# Patient Record
Sex: Female | Born: 1995
Health system: Southern US, Community
[De-identification: ages and names within clinical notes are randomized; demographics above are authoritative.]

## PROBLEM LIST (undated history)

## (undated) ENCOUNTER — Ambulatory Visit (HOSPITAL_COMMUNITY): Source: Home / Self Care

## (undated) ENCOUNTER — Inpatient Hospital Stay (HOSPITAL_COMMUNITY): Payer: Self-pay

## (undated) DIAGNOSIS — M419 Scoliosis, unspecified: Secondary | ICD-10-CM

## (undated) DIAGNOSIS — M1710 Unilateral primary osteoarthritis, unspecified knee: Secondary | ICD-10-CM

## (undated) DIAGNOSIS — M179 Osteoarthritis of knee, unspecified: Secondary | ICD-10-CM

## (undated) DIAGNOSIS — N83209 Unspecified ovarian cyst, unspecified side: Secondary | ICD-10-CM

---
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
## 2017-09-07 ENCOUNTER — Encounter (HOSPITAL_BASED_OUTPATIENT_CLINIC_OR_DEPARTMENT_OTHER): Payer: Self-pay | Admitting: *Deleted

## 2017-09-07 ENCOUNTER — Other Ambulatory Visit: Payer: Self-pay

## 2017-09-07 ENCOUNTER — Emergency Department (HOSPITAL_BASED_OUTPATIENT_CLINIC_OR_DEPARTMENT_OTHER): Payer: Self-pay

## 2017-09-07 ENCOUNTER — Emergency Department (HOSPITAL_BASED_OUTPATIENT_CLINIC_OR_DEPARTMENT_OTHER)
Admission: EM | Admit: 2017-09-07 | Discharge: 2017-09-07 | Disposition: A | Discharge status: Home / Self Care | Payer: Worker's Compensation | Attending: Emergency Medicine | Admitting: Emergency Medicine

## 2017-09-07 DIAGNOSIS — I10 Essential (primary) hypertension: Secondary | ICD-10-CM | POA: Insufficient documentation

## 2017-09-07 DIAGNOSIS — W230XXA Caught, crushed, jammed, or pinched between moving objects, initial encounter: Secondary | ICD-10-CM | POA: Insufficient documentation

## 2017-09-07 DIAGNOSIS — S5781XA Crushing injury of right forearm, initial encounter: Secondary | ICD-10-CM | POA: Insufficient documentation

## 2017-09-07 DIAGNOSIS — M79601 Pain in right arm: Secondary | ICD-10-CM | POA: Insufficient documentation

## 2017-09-07 DIAGNOSIS — Z87891 Personal history of nicotine dependence: Secondary | ICD-10-CM | POA: Insufficient documentation

## 2017-09-07 DIAGNOSIS — Y929 Unspecified place or not applicable: Secondary | ICD-10-CM | POA: Insufficient documentation

## 2017-09-07 DIAGNOSIS — T148XXA Other injury of unspecified body region, initial encounter: Secondary | ICD-10-CM

## 2017-09-07 DIAGNOSIS — Y998 Other external cause status: Secondary | ICD-10-CM | POA: Insufficient documentation

## 2017-09-07 DIAGNOSIS — Y939 Activity, unspecified: Secondary | ICD-10-CM | POA: Insufficient documentation

## 2017-09-07 DIAGNOSIS — Z79899 Other long term (current) drug therapy: Secondary | ICD-10-CM | POA: Insufficient documentation

## 2017-09-07 MED ORDER — METHOCARBAMOL 500 MG PO TABS: 500.0000 mg | ORAL_TABLET | Freq: Every evening | ORAL | 0 refills | Status: DC | PRN

## 2017-09-07 MED ORDER — NAPROXEN 500 MG PO TABS: 500.0000 mg | ORAL_TABLET | Freq: Two times a day (BID) | ORAL | 0 refills | Status: DC

## 2017-09-07 MED FILL — NAPROXEN 500 MG TABLET: 500 | 15 days supply | Qty: 30 | Fill #0

## 2017-09-07 MED FILL — METHOCARBAMOL 500 MG TABLET: 500 | 4 days supply | Qty: 4 | Fill #0

## 2017-09-07 NOTE — ED Triage Notes (Signed)
2 bracelets and 1 ring removed and given to pt in case of swelling.

## 2017-09-07 NOTE — ED Triage Notes (Signed)
Pt reports a coworker accidentally closed a door on her right arm. + radial pulse, able to move fingers,warm to touch, cap refill < 3 sec, pt denies any other injuries or c/o.

## 2017-09-07 NOTE — ED Provider Notes (Signed)
[facility] EMERGENCY DEPARTMENT Provider Note   CSN: [id number] Arrival date & time: 09/07/17  1157     History   Chief Complaint Chief Complaint  Patient presents with  . Arm Pain    HPI Marilyn Ramirez is a 22 y.o. female presenting for evaluation of right forearm pain.  Pt states that just prior to arrival, somebody closed a door on her right forearm.  She reports pain of the dorsal aspect of her proximal right forearm.  No pain of the wrist or elbow.  She reports worsening pain with palmar flexion of the wrist.  She denies numbness or tingling.  She has not taken anything for pain including Tylenol or ibuprofen.  She has no other medical problems, does not take medications daily.  She denies injury elsewhere.  She is not on blood thinners.  It is constant, worse with movement and palpation.  Nothing makes it better.  No radiation of the pain  HPI  Past Medical History:  Diagnosis Date  . Osteoarthritis of knee   . Ovarian cyst   . Scoliosis     Patient Active Problem List   Diagnosis Date Noted  . Low back pain 03/14/2011  . HYPERTENSION 09/17/2007  . SKIN RASH 09/17/2007  . PROTEINURIA 09/17/2007  . DERMATITIS, ATOPIC 09/20/2006    History reviewed. No pertinent surgical history.   OB History   None      Home Medications    Prior to Admission medications   Medication Sig Start Date End Date Taking? Authorizing Provider  Dextrose-Fructose-Sod Citrate (NAUZENE) [id number] MG CHEW Chew 1 tablet by mouth daily as needed (nausea).    [provider]  methocarbamol (ROBAXIN) 500 MG tablet Take 1 tablet (500 mg total) by mouth at bedtime as needed for muscle spasms. 09/07/17   Oris Calmes, PA-C  naproxen (NAPROSYN) 500 MG tablet Take 1 tablet (500 mg total) by mouth 2 (two) times daily with a meal. 09/07/17   Kiyoshi Schaab, PA-C  omeprazole (PRILOSEC) 20 MG capsule Take 1 capsule (20 mg total) by mouth daily. 12/05/16   Hedges, Tinnie Gens,  PA-C  ondansetron (ZOFRAN ODT) 8 MG disintegrating tablet Take 1 tablet (8 mg total) by mouth every 8 (eight) hours as needed for nausea or vomiting. Patient not taking: Reported on 12/05/2016 09/28/16   Linwood Dibbles, MD    Family History History reviewed. No pertinent family history.  Social History Social History   Tobacco Use  . Smoking status: Former Games developer  . Smokeless tobacco: Never Used  Substance Use Topics  . Alcohol use: No  . Drug use: No     Allergies   Patient has no known allergies.   Review of Systems Review of Systems  Musculoskeletal: Positive for myalgias.  Neurological: Negative for numbness.     Physical Exam Updated Vital Signs BP 111/70 (BP Location: Left Arm)   Pulse 76   Temp 98.5 F (36.9 C) (Oral)   Resp 16   Ht  (1.575 m)   Wt 51.3 kg (113 lb)   LMP 08/23/2017   SpO2 100%   BMI 20.67 kg/m   Physical Exam  Constitutional: She is oriented to person, place, and time. She appears well-developed and well-nourished. No distress.  HENT:  Head: Normocephalic and atraumatic.  Eyes: EOM are normal.  Neck: Normal range of motion.  Pulmonary/Chest: Effort normal.  Abdominal: She exhibits no distension.  Musculoskeletal: Normal range of motion.  No obvious swelling, contusion, hematoma, or injury of  the right forearm.  Tenderness to palpation of the dorsal aspect of the proximal right forearm.  Full active range of motion of the wrist and elbow with pain of the forearm.  No tenderness to palpation of the distal forearm.  Soft compartments.  Radial pulses intact bilaterally.  Grip strength intact bilaterally.  No injury noted elsewhere.  Neurological: She is alert and oriented to person, place, and time.  Skin: Skin is warm. No rash noted.  Psychiatric: She has a normal mood and affect.  Nursing note and vitals reviewed.    ED Treatments / Results  Labs (all labs ordered are listed, but only abnormal results are displayed) Labs Reviewed -  No data to display  EKG None  Radiology Dg Forearm Right  Result Date: 09/07/2017 CLINICAL DATA:  Right arm got caught in the door. EXAM: RIGHT FOREARM - 2 VIEW COMPARISON:  None. FINDINGS: There is no evidence of fracture or other focal bone lesions. Soft tissues are unremarkable. IMPRESSION: Negative. Electronically Signed   By: Obie Dredge M.D.   On: 09/07/2017 12:33    Procedures Procedures (including critical care time)  Medications Ordered in ED Medications - No data to display   Initial Impression / Assessment and Plan / ED Course  I have reviewed the triage vital signs and the nursing notes.  Pertinent labs & imaging results that were available during my care of the patient were reviewed by me and considered in my medical decision making (see chart for details).     Patient resenting for evaluation of right forearm pain after to be closed arm in a door.  Physical exam reassuring, she is neurovascularly intact with soft compartments.  X-ray reviewed and interpreted by me, no fracture dislocation.  Discussed findings with patient.  Discussed this is likely muscular injury.  Discussed symptom medic treatment with NSAIDs, muscle relaxers, muscle creams, heat, ice, and stretching.  Follow-up as needed.  Return precautions given including signs for compartment syndrome.  At this time, patient appears safe for discharge.  Return precautions given.  Patient states she understands and agrees to plan.   Final Clinical Impressions(s) / ED Diagnoses   Final diagnoses:  Right arm pain  Muscle contusion    ED Discharge Orders        Ordered    naproxen (NAPROSYN) 500 MG tablet  2 times daily with meals     09/07/17 1419    methocarbamol (ROBAXIN) 500 MG tablet  At bedtime PRN     09/07/17 1419       Serapio Edelson, PA-C 09/07/17 1511    Tegeler, Canary Brim, MD 09/07/17 [phone]

## 2017-09-07 NOTE — Discharge Instructions (Signed)
Take naproxen 2 times a day with meals.  Do not take other anti-inflammatories at the same time open (Advil, Motrin, ibuprofen, Aleve). You may supplement with Tylenol if you need further pain control. Use ice packs or heating pads if this helps control your pain. Use Robaxin as needed for muscle stiffness or soreness.  Have caution, as this may make you tired or groggy.  Do not drive or operate heavy machinery while taking this medicine. Use muscle creams, such as BenGay, salon poss, or icy hot, to help with muscle pain. Follow up in 1 week if your symptoms are not improving. Return to the emergency room if you develop numbness, severe pain, your arm becomes very hard, or any new or concerning symptoms.

## 2018-04-09 ENCOUNTER — Encounter (HOSPITAL_COMMUNITY): Payer: Self-pay | Admitting: *Deleted

## 2018-04-09 ENCOUNTER — Ambulatory Visit (HOSPITAL_COMMUNITY)
Admission: EM | Admit: 2018-04-09 | Discharge: 2018-04-09 | Disposition: A | Discharge status: Home / Self Care | Payer: Self-pay | Attending: Urgent Care | Admitting: Urgent Care

## 2018-04-09 ENCOUNTER — Other Ambulatory Visit: Payer: Self-pay

## 2018-04-09 DIAGNOSIS — L03213 Periorbital cellulitis: Secondary | ICD-10-CM | POA: Insufficient documentation

## 2018-04-09 DIAGNOSIS — H5711 Ocular pain, right eye: Secondary | ICD-10-CM | POA: Insufficient documentation

## 2018-04-09 MED ORDER — CEFDINIR 300 MG PO CAPS: 300.0000 mg | ORAL_CAPSULE | Freq: Two times a day (BID) | ORAL | 0 refills | Status: DC

## 2018-04-09 NOTE — ED Triage Notes (Signed)
States her eyes started swelling Sat, states she used bendayl  And today noticed a rash around her eye.

## 2018-04-09 NOTE — ED Provider Notes (Signed)
MRN: [id number] DOB: 1995/08/27  Subjective:   Marilyn Ramirez is a 22 y.o. female presenting for 2-day history of bilateral eye swelling and eye discomfort.  States that her right eye continues to bother her including eyelid swelling and pain, discoloration over underside of her eyes bilaterally.  She has tried Benadryl to help with her symptoms and has experienced mild relief.  Denies history of severe allergies or eye trauma, fever, sinus pain, sinus congestion, ear pain, throat pain, cough.  No current facility-administered medications for this encounter.   Current Outpatient Medications:  .  Dextrose-Fructose-Sod Citrate (NAUZENE) [phone] MG CHEW, Chew 1 tablet by mouth daily as needed (nausea)., Disp: , Rfl:  .  methocarbamol (ROBAXIN) 500 MG tablet, Take 1 tablet (500 mg total) by mouth at bedtime as needed for muscle spasms., Disp: 4 tablet, Rfl: 0 .  naproxen (NAPROSYN) 500 MG tablet, Take 1 tablet (500 mg total) by mouth 2 (two) times daily with a meal., Disp: 30 tablet, Rfl: 0 .  omeprazole (PRILOSEC) 20 MG capsule, Take 1 capsule (20 mg total) by mouth daily., Disp: 30 capsule, Rfl: 0 .  ondansetron (ZOFRAN ODT) 8 MG disintegrating tablet, Take 1 tablet (8 mg total) by mouth every 8 (eight) hours as needed for nausea or vomiting. (Patient not taking: Reported on 12/05/2016), Disp: 12 tablet, Rfl: 0   No Known Allergies  Past Medical History:  Diagnosis Date  . Osteoarthritis of knee   . Ovarian cyst   . Scoliosis     History reviewed. No pertinent surgical history.  Objective:   Vitals: BP 104/65 (BP Location: Right Arm)   Pulse 83   Temp 98.2 F (36.8 C) (Oral)   Resp 14   LMP 03/22/2018 (Exact Date)   SpO2 99%   Physical Exam Constitutional:      General: She is not in acute distress.    Appearance: Normal appearance. She is well-developed. She is not ill-appearing, toxic-appearing or diaphoretic.  HENT:     Right Ear: Tympanic membrane normal.     Left Ear:  Tympanic membrane normal.     Nose: Nose normal. No congestion or rhinorrhea.     Mouth/Throat:     Mouth: Mucous membranes are moist.     Pharynx: Oropharynx is clear. No oropharyngeal exudate or posterior oropharyngeal erythema.  Eyes:     General: No scleral icterus.       Right eye: No discharge or hordeolum.        Left eye: No discharge or hordeolum.     Extraocular Movements: Extraocular movements intact.     Conjunctiva/sclera:     Right eye: Right conjunctiva is injected (Mildly). No chemosis, exudate or hemorrhage.    Left eye: Left conjunctiva is not injected. No chemosis, exudate or hemorrhage.    Pupils: Pupils are equal, round, and reactive to light.     Comments: Right eyelid swelling with slight hyperpigmentation of lower right eyelid.  Eyelids are tender to palpation.  Cardiovascular:     Rate and Rhythm: Normal rate.  Pulmonary:     Effort: Pulmonary effort is normal.  Skin:    General: Skin is warm and dry.  Neurological:     Mental Status: She is alert and oriented to person, place, and time.  Psychiatric:        Mood and Affect: Mood normal.        Behavior: Behavior normal.     Assessment and Plan :   Preseptal cellulitis of right eye  Acute right eye pain  Will cover for preseptal cellulitis with cefdinir which likely started from a allergic conjunctivitis.  Counseled that she needs to take Zyrtec and Opcon-A to address this.  Strict ER and return to clinic precautions reviewed.     Wallis Bamberg, [place] 04/09/18 2038

## 2018-04-09 NOTE — Discharge Instructions (Signed)
Take Zyrtec, Allegra or Claritin to address allergies as a source of your symptoms. You may take 500mg  Tylenol with ibuprofen 400-600mg  every 6 hours for pain and inflammation. Do not wait more than 2 days for a recheck if your symptoms are persisting.

## 2018-12-11 ENCOUNTER — Emergency Department (HOSPITAL_COMMUNITY): Payer: Self-pay

## 2018-12-11 ENCOUNTER — Emergency Department (HOSPITAL_COMMUNITY)
Admission: EM | Admit: 2018-12-11 | Discharge: 2018-12-11 | Disposition: A | Discharge status: Home / Self Care | Payer: Self-pay | Attending: Emergency Medicine | Admitting: Emergency Medicine

## 2018-12-11 ENCOUNTER — Other Ambulatory Visit: Payer: Self-pay

## 2018-12-11 ENCOUNTER — Encounter (HOSPITAL_COMMUNITY): Payer: Self-pay

## 2018-12-11 DIAGNOSIS — Y990 Civilian activity done for income or pay: Secondary | ICD-10-CM | POA: Insufficient documentation

## 2018-12-11 DIAGNOSIS — Y9289 Other specified places as the place of occurrence of the external cause: Secondary | ICD-10-CM | POA: Insufficient documentation

## 2018-12-11 DIAGNOSIS — M542 Cervicalgia: Secondary | ICD-10-CM | POA: Insufficient documentation

## 2018-12-11 DIAGNOSIS — S0990XA Unspecified injury of head, initial encounter: Secondary | ICD-10-CM

## 2018-12-11 DIAGNOSIS — I10 Essential (primary) hypertension: Secondary | ICD-10-CM | POA: Insufficient documentation

## 2018-12-11 DIAGNOSIS — W19XXXA Unspecified fall, initial encounter: Secondary | ICD-10-CM | POA: Insufficient documentation

## 2018-12-11 DIAGNOSIS — R55 Syncope and collapse: Secondary | ICD-10-CM

## 2018-12-11 DIAGNOSIS — Y9389 Activity, other specified: Secondary | ICD-10-CM | POA: Insufficient documentation

## 2018-12-11 DIAGNOSIS — Z87891 Personal history of nicotine dependence: Secondary | ICD-10-CM | POA: Insufficient documentation

## 2018-12-11 LAB — CBC
HCT: 40.3 % (ref 36.0–46.0)
Hemoglobin: 13.6 g/dL (ref 12.0–15.0)
MCH: 29.9 pg (ref 26.0–34.0)
MCHC: 33.7 g/dL (ref 30.0–36.0)
MCV: 88.6 fL (ref 80.0–100.0)
Platelets: 261 10*3/uL (ref 150–400)
RBC: 4.55 MIL/uL (ref 3.87–5.11)
RDW: 11.9 % (ref 11.5–15.5)
WBC: 4.6 10*3/uL (ref 4.0–10.5)
nRBC: 0 % (ref 0.0–0.2)

## 2018-12-11 LAB — I-STAT BETA HCG BLOOD, ED (MC, WL, AP ONLY): I-stat hCG, quantitative: <5 m[IU]/mL (ref <5)

## 2018-12-11 LAB — CBG MONITORING, ED: Glucose-Capillary: 78 mg/dL (ref 70–99)

## 2018-12-11 LAB — BASIC METABOLIC PANEL
Anion gap: 6 (ref 5–15)
BUN: 15 mg/dL (ref 6–20)
CO2: 25 mmol/L (ref 22–32)
Calcium: 8.8 mg/dL — ABNORMAL LOW (ref 8.9–10.3)
Chloride: 105 mmol/L (ref 98–111)
Creatinine, Ser: 0.72 mg/dL (ref 0.44–1.00)
GFR calc Af Amer: >60 mL/min (ref >60)
GFR calc non Af Amer: >60 mL/min (ref >60)
Glucose, Bld: 88 mg/dL (ref 70–99)
Potassium: 3.8 mmol/L (ref 3.5–5.1)
Sodium: 136 mmol/L (ref 135–145)

## 2018-12-11 MED ORDER — NAPROXEN 500 MG PO TABS: 500.0000 mg | ORAL_TABLET | Freq: Two times a day (BID) | ORAL | 0 refills | Status: DC

## 2018-12-11 MED ORDER — KETOROLAC TROMETHAMINE 30 MG/ML IJ SOLN
30.0000 mg | Freq: Once | INTRAMUSCULAR | Status: AC
  Administered 2018-12-11: 14:00:00 30 mg via INTRAMUSCULAR
  Filled 2018-12-11: qty 1

## 2018-12-11 MED ORDER — SODIUM CHLORIDE 0.9 % IV BOLUS
1000.0000 mL | Freq: Once | INTRAVENOUS | Status: AC
  Administered 2018-12-11: 1000 mL via INTRAVENOUS

## 2018-12-11 MED ORDER — ACETAMINOPHEN 325 MG PO TABS
650.0000 mg | ORAL_TABLET | Freq: Once | ORAL | Status: AC
  Administered 2018-12-11: 13:00:00 650 mg via ORAL
  Filled 2018-12-11: qty 2

## 2018-12-11 NOTE — Discharge Instructions (Signed)
Take the Naprosyn as needed.  Apply ice to help with the pain and discomfort.  Return to the ED as needed for worsening symptoms and consider follow-up with your primary care doctor if you have any recurrent episodes.

## 2018-12-11 NOTE — ED Triage Notes (Signed)
Pt BIB EMS from work. Pt reports passing out, falling down and hitting her head. Pt has c-collar on. Pt c/o neck pain. Upon arrival pt was groggy with EMS. Pt A&Ox4 with EMS.   113/78 CBG 89

## 2018-12-11 NOTE — ED Notes (Signed)
An After Visit Summary was printed and given to the patient. Discharge instructions given and no further questions at this time. Pt leaving with significant other 

## 2018-12-11 NOTE — ED Provider Notes (Signed)
[facility]-EMERGENCY DEPT Provider Note   CSN: [id number] Arrival date & time: 12/11/18  1145    History   Chief Complaint Chief Complaint  Patient presents with  . Loss of Consciousness    HPI Lauraine Rinneana Timoney is a 23 y.o. female.     HPI Patient presents to the emergency room for evaluation of a syncopal episode.  Patient states she was at work today.  She started to feel hot and overheated the next thing the patient remembers is when EMS arrived..  No seizure activity reported.  EMS arrived.  Patient was complaining of headache and neck pain.  She did feel a knot on the back of her head.  Patient states she was not feeling sick prior to that episode.  She has not had any trouble with headaches fevers or chills.  She has not been coughing.  No vomiting or diarrhea.  She has had normal menstrual periods.  Patient now does have pain primarily in the back of her head and her neck. Past Medical History:  Diagnosis Date  . Osteoarthritis of knee   . Ovarian cyst   . Scoliosis     Patient Active Problem List   Diagnosis Date Noted  . Low back pain 03/14/2011  . HYPERTENSION 09/17/2007  . SKIN RASH 09/17/2007  . PROTEINURIA 09/17/2007  . DERMATITIS, ATOPIC 09/20/2006    History reviewed. No pertinent surgical history.   OB History   No obstetric history on file.      Home Medications    Prior to Admission medications   Medication Sig Start Date End Date Taking? Authorizing Provider  methocarbamol (ROBAXIN) 500 MG tablet Take 1 tablet (500 mg total) by mouth at bedtime as needed for muscle spasms. Patient not taking: Reported on 12/11/2018 09/07/17   Caccavale, Sophia, PA-C  naproxen (NAPROSYN) 500 MG tablet Take 1 tablet (500 mg total) by mouth 2 (two) times daily. 12/11/18   Linwood DibblesKnapp, Kenishia Plack, MD  omeprazole (PRILOSEC) 20 MG capsule Take 1 capsule (20 mg total) by mouth daily. Patient not taking: Reported on 12/11/2018 12/05/16   Hedges, Tinnie GensJeffrey, PA-C   ondansetron (ZOFRAN ODT) 8 MG disintegrating tablet Take 1 tablet (8 mg total) by mouth every 8 (eight) hours as needed for nausea or vomiting. Patient not taking: Reported on 12/05/2016 09/28/16   Linwood DibblesKnapp, Lataisha Colan, MD    Family History History reviewed. No pertinent family history.  Social History Social History   Tobacco Use  . Smoking status: Former Games developermoker  . Smokeless tobacco: Never Used  Substance Use Topics  . Alcohol use: No  . Drug use: No     Allergies   Patient has no known allergies.   Review of Systems Review of Systems  All other systems reviewed and are negative.    Physical Exam Updated Vital Signs BP 112/73   Pulse 70   Temp 99 F (37.2 C) (Oral)   Resp (!) 21   LMP 11/20/2018   SpO2 99%   Physical Exam Vitals signs and nursing note reviewed.  Constitutional:      General: She is not in acute distress.    Appearance: She is well-developed.  HENT:     Head: Normocephalic.     Comments: Tenderness to palpation posterior occiput    Right Ear: External ear normal.     Left Ear: External ear normal.  Eyes:     General: No scleral icterus.       Right eye: No discharge.  Left eye: No discharge.     Conjunctiva/sclera: Conjunctivae normal.  Neck:     Musculoskeletal: Neck supple.     Trachea: No tracheal deviation.  Cardiovascular:     Rate and Rhythm: Normal rate and regular rhythm.  Pulmonary:     Effort: Pulmonary effort is normal. No respiratory distress.     Breath sounds: Normal breath sounds. No stridor. No wheezing or rales.  Abdominal:     General: Bowel sounds are normal. There is no distension.     Palpations: Abdomen is soft.     Tenderness: There is no abdominal tenderness. There is no guarding or rebound.  Musculoskeletal:     Right shoulder: She exhibits no tenderness, no bony tenderness and no swelling.     Left shoulder: She exhibits no tenderness, no bony tenderness and no swelling.     Right wrist: She exhibits no  tenderness, no bony tenderness and no swelling.     Left wrist: She exhibits no tenderness, no bony tenderness and no swelling.     Right hip: She exhibits normal range of motion, no tenderness, no bony tenderness and no swelling.     Left hip: She exhibits normal range of motion, no tenderness and no bony tenderness.     Right ankle: She exhibits no swelling. No tenderness.     Left ankle: She exhibits no swelling. No tenderness.     Cervical back: She exhibits tenderness and bony tenderness. She exhibits no swelling.     Thoracic back: She exhibits no tenderness, no bony tenderness and no swelling.     Lumbar back: She exhibits no tenderness, no bony tenderness and no swelling.  Skin:    General: Skin is warm and dry.     Findings: No rash.  Neurological:     Mental Status: She is alert.     Cranial Nerves: No cranial nerve deficit (no facial droop, extraocular movements intact, no slurred speech).     Sensory: No sensory deficit.     Motor: No abnormal muscle tone or seizure activity.     Coordination: Coordination normal.      ED Treatments / Results  Labs (all labs ordered are listed, but only abnormal results are displayed) Labs Reviewed  BASIC METABOLIC PANEL - Abnormal; Notable for the following components:      Result Value   Calcium 8.8 (*)    All other components within normal limits  CBC  CBG MONITORING, ED  I-STAT BETA HCG BLOOD, ED ([facility], [facility], [facility] ONLY)    EKG EKG Interpretation  Date/Time:  Tuesday December 11 2018 12:31:32 EDT Ventricular Rate:  69 PR Interval:    QRS Duration: 80 QT Interval:  385 QTC Calculation: 413 R Axis:   86 Text Interpretation:  Sinus rhythm No significant change since last tracing Confirmed by Linwood DibblesKnapp, Ruberta Holck [phone] on 12/11/2018 1:42:48 PM   Radiology Ct Head Wo Contrast  Result Date: 12/11/2018 CLINICAL DATA:  Syncope with fall EXAM: CT HEAD WITHOUT CONTRAST CT CERVICAL SPINE WITHOUT CONTRAST TECHNIQUE: Multidetector CT imaging of  the head and cervical spine was performed following the standard protocol without intravenous contrast. Multiplanar CT image reconstructions of the cervical spine were also generated. COMPARISON:  None. FINDINGS: CT HEAD FINDINGS Brain: The ventricles are normal in size and configuration. There is no intracranial mass, hemorrhage, extra-axial fluid collection, midline shift. Brain parenchyma appears unremarkable. No acute infarct evident. Vascular: No hyperdense vessel. No vascular calcifications are evident. Skull: The bony calvarium appears  intact. Sinuses/Orbits: There is opacification in a left-sided ethmoid air cell. Other visualized paranasal sinuses are clear. Orbits appear symmetric bilaterally. Other: Mastoid air cells are clear. CT CERVICAL SPINE FINDINGS Alignment: There is no evidence spondylolisthesis. Skull base and vertebrae: Skull base and craniocervical junction regions appear normal. No evident fracture. No blastic or lytic bone lesions. Soft tissues and spinal canal: Prevertebral soft tissues and predental space regions are normal. No cord or canal hematoma. No paraspinous lesions. Disc levels: Disc spaces appear unremarkable. There is no nerve root edema or effacement. No disc extrusion or stenosis. Upper chest: Visualized upper lung regions are clear. Other: None IMPRESSION: CT head: Opacification in a left ethmoid air cell. Study otherwise unremarkable. CT cervical spine: No fracture or spondylolisthesis. No appreciable arthropathy. No nerve root edema or effacement. No disc extrusion or stenosis. Electronically Signed   By: Bretta BangWilliam  Woodruff III M.D.   On: 12/11/2018 13:41   Ct Cervical Spine Wo Contrast  Result Date: 12/11/2018 CLINICAL DATA:  Syncope with fall EXAM: CT HEAD WITHOUT CONTRAST CT CERVICAL SPINE WITHOUT CONTRAST TECHNIQUE: Multidetector CT imaging of the head and cervical spine was performed following the standard protocol without intravenous contrast. Multiplanar CT image  reconstructions of the cervical spine were also generated. COMPARISON:  None. FINDINGS: CT HEAD FINDINGS Brain: The ventricles are normal in size and configuration. There is no intracranial mass, hemorrhage, extra-axial fluid collection, midline shift. Brain parenchyma appears unremarkable. No acute infarct evident. Vascular: No hyperdense vessel. No vascular calcifications are evident. Skull: The bony calvarium appears intact. Sinuses/Orbits: There is opacification in a left-sided ethmoid air cell. Other visualized paranasal sinuses are clear. Orbits appear symmetric bilaterally. Other: Mastoid air cells are clear. CT CERVICAL SPINE FINDINGS Alignment: There is no evidence spondylolisthesis. Skull base and vertebrae: Skull base and craniocervical junction regions appear normal. No evident fracture. No blastic or lytic bone lesions. Soft tissues and spinal canal: Prevertebral soft tissues and predental space regions are normal. No cord or canal hematoma. No paraspinous lesions. Disc levels: Disc spaces appear unremarkable. There is no nerve root edema or effacement. No disc extrusion or stenosis. Upper chest: Visualized upper lung regions are clear. Other: None IMPRESSION: CT head: Opacification in a left ethmoid air cell. Study otherwise unremarkable. CT cervical spine: No fracture or spondylolisthesis. No appreciable arthropathy. No nerve root edema or effacement. No disc extrusion or stenosis. Electronically Signed   By: Bretta BangWilliam  Woodruff III M.D.   On: 12/11/2018 13:41    Procedures Procedures (including critical care time)  Medications Ordered in ED Medications  ketorolac (TORADOL) 30 MG/ML injection 30 mg (has no administration in time range)  acetaminophen (TYLENOL) tablet 650 mg (650 mg Oral Given 12/11/18 1249)  sodium chloride 0.9 % bolus 1,000 mL (1,000 mLs Intravenous Bolus from Bag 12/11/18 1249)     Initial Impression / Assessment and Plan / ED Course  I have reviewed the triage vital  signs and the nursing notes.  Pertinent labs & imaging results that were available during my care of the patient were reviewed by me and considered in my medical decision making (see chart for details).  Clinical Course as of Dec 11 1419  Tue Dec 11, 2018  1342 Initial labs unremarkable   [JK]    Clinical Course User Index [JK] Linwood DibblesKnapp, Tinea Nobile, MD     ED work-up is reassuring.  No signs of serious injury associated with her fall.  Symptoms most likely related to a vasovagal episode.  No dysrhythmia noted in  the ED.  No anemia or signs of severe dehydration.  Pregnancy test is negative.  Discussed use of NSAIDs and ice.  Outpatient follow-up discussed for any recurrent episodes.  Final Clinical Impressions(s) / ED Diagnoses   Final diagnoses:  Vasovagal syncope  Minor head injury, initial encounter    ED Discharge Orders         Ordered    naproxen (NAPROSYN) 500 MG tablet  2 times daily     12/11/18 1419           Dorie Rank, MD 12/11/18 1422

## 2019-07-08 ENCOUNTER — Encounter (HOSPITAL_COMMUNITY): Payer: Self-pay

## 2019-07-08 ENCOUNTER — Other Ambulatory Visit: Payer: Self-pay

## 2019-07-08 ENCOUNTER — Emergency Department (HOSPITAL_COMMUNITY)
Admission: EM | Admit: 2019-07-08 | Discharge: 2019-07-08 | Disposition: A | Discharge status: Home / Self Care | Payer: Self-pay | Attending: Emergency Medicine | Admitting: Emergency Medicine

## 2019-07-08 DIAGNOSIS — Z87891 Personal history of nicotine dependence: Secondary | ICD-10-CM | POA: Insufficient documentation

## 2019-07-08 DIAGNOSIS — Z79899 Other long term (current) drug therapy: Secondary | ICD-10-CM | POA: Insufficient documentation

## 2019-07-08 DIAGNOSIS — R109 Unspecified abdominal pain: Secondary | ICD-10-CM | POA: Insufficient documentation

## 2019-07-08 DIAGNOSIS — R531 Weakness: Secondary | ICD-10-CM | POA: Insufficient documentation

## 2019-07-08 DIAGNOSIS — I10 Essential (primary) hypertension: Secondary | ICD-10-CM | POA: Insufficient documentation

## 2019-07-08 DIAGNOSIS — R112 Nausea with vomiting, unspecified: Secondary | ICD-10-CM | POA: Insufficient documentation

## 2019-07-08 LAB — COMPREHENSIVE METABOLIC PANEL
ALT: 14 U/L (ref 0–44)
AST: 21 U/L (ref 15–41)
Albumin: 4.2 g/dL (ref 3.5–5.0)
Alkaline Phosphatase: 55 U/L (ref 38–126)
Anion gap: 11 (ref 5–15)
BUN: 15 mg/dL (ref 6–20)
CO2: 25 mmol/L (ref 22–32)
Calcium: 9.1 mg/dL (ref 8.9–10.3)
Chloride: 103 mmol/L (ref 98–111)
Creatinine, Ser: 1 mg/dL (ref 0.44–1.00)
GFR calc Af Amer: >60 mL/min (ref >60)
GFR calc non Af Amer: >60 mL/min (ref >60)
Glucose, Bld: 70 mg/dL (ref 70–99)
Potassium: 3.6 mmol/L (ref 3.5–5.1)
Sodium: 139 mmol/L (ref 135–145)
Total Bilirubin: 1.2 mg/dL (ref 0.3–1.2)
Total Protein: 7.8 g/dL (ref 6.5–8.1)

## 2019-07-08 LAB — I-STAT BETA HCG BLOOD, ED (MC, WL, AP ONLY): I-stat hCG, quantitative: <5 m[IU]/mL (ref <5)

## 2019-07-08 LAB — CBC
HCT: 41.5 % (ref 36.0–46.0)
Hemoglobin: 13.5 g/dL (ref 12.0–15.0)
MCH: 29.3 pg (ref 26.0–34.0)
MCHC: 32.5 g/dL (ref 30.0–36.0)
MCV: 90 fL (ref 80.0–100.0)
Platelets: 256 10*3/uL (ref 150–400)
RBC: 4.61 MIL/uL (ref 3.87–5.11)
RDW: 11.3 % — ABNORMAL LOW (ref 11.5–15.5)
WBC: 4.5 10*3/uL (ref 4.0–10.5)
nRBC: 0 % (ref 0.0–0.2)

## 2019-07-08 LAB — LIPASE, BLOOD: Lipase: 28 U/L (ref 11–51)

## 2019-07-08 MED ORDER — ONDANSETRON 4 MG PO TBDP: 4.0000 mg | ORAL_TABLET | Freq: Three times a day (TID) | ORAL | 0 refills | Status: DC | PRN

## 2019-07-08 MED ORDER — SODIUM CHLORIDE 0.9 % IV BOLUS
1000.0000 mL | Freq: Once | INTRAVENOUS | Status: AC
  Administered 2019-07-08: 1000 mL via INTRAVENOUS

## 2019-07-08 MED ORDER — ONDANSETRON HCL 4 MG/2ML IJ SOLN
4.0000 mg | Freq: Once | INTRAMUSCULAR | Status: AC
  Administered 2019-07-08: 4 mg via INTRAVENOUS
  Filled 2019-07-08: qty 2

## 2019-07-08 MED ORDER — ALUM & MAG HYDROXIDE-SIMETH 200-200-20 MG/5ML PO SUSP
30.0000 mL | Freq: Once | ORAL | Status: AC
  Administered 2019-07-08: 30 mL via ORAL
  Filled 2019-07-08: qty 30

## 2019-07-08 NOTE — ED Provider Notes (Signed)
[facility] EMERGENCY DEPARTMENT Provider Note   CSN: [id number] Arrival date & time: 07/08/19  0856     History Chief Complaint  Patient presents with  . Emesis  . Weakness    Marilyn Ramirez is a 24 y.o. female.  24 yo F with a chief complaints of nausea and vomiting.  This been going on for about 3 days now.  Patient states that she is not been able to eat and drink anything.  She has some mild diffuse abdominal pain.  Denies diarrhea denies sick contacts denies suspicious food intake.  Denies fevers denies cough.  Denies new medication.  Denies likelihood of being pregnant.  The history is provided by the patient.  Emesis Associated symptoms: abdominal pain   Associated symptoms: no arthralgias, no chills, no fever, no headaches and no myalgias   Weakness Associated symptoms: abdominal pain, nausea and vomiting   Associated symptoms: no arthralgias, no chest pain, no dizziness, no dysuria, no fever, no headaches, no myalgias, no shortness of breath and no urgency   Illness Severity:  Moderate Onset quality:  Gradual Duration:  2 days Timing:  Constant Progression:  Worsening Chronicity:  New Associated symptoms: abdominal pain, nausea and vomiting   Associated symptoms: no chest pain, no congestion, no fever, no headaches, no myalgias, no rhinorrhea, no shortness of breath and no wheezing        Past Medical History:  Diagnosis Date  . Osteoarthritis of knee   . Ovarian cyst   . Scoliosis     Patient Active Problem List   Diagnosis Date Noted  . Low back pain 03/14/2011  . HYPERTENSION 09/17/2007  . SKIN RASH 09/17/2007  . PROTEINURIA 09/17/2007  . DERMATITIS, ATOPIC 09/20/2006    History reviewed. No pertinent surgical history.   OB History   No obstetric history on file.     No family history on file.  Social History   Tobacco Use  . Smoking status: Former Research scientist (life sciences)  . Smokeless tobacco: Never Used  Substance Use Topics  .  Alcohol use: No  . Drug use: No    Home Medications Prior to Admission medications   Medication Sig Start Date End Date Taking? Authorizing Provider  methocarbamol (ROBAXIN) 500 MG tablet Take 1 tablet (500 mg total) by mouth at bedtime as needed for muscle spasms. Patient not taking: Reported on 12/11/2018 09/07/17   Caccavale, Sophia, PA-C  naproxen (NAPROSYN) 500 MG tablet Take 1 tablet (500 mg total) by mouth 2 (two) times daily. 12/11/18   Dorie Rank, MD  omeprazole (PRILOSEC) 20 MG capsule Take 1 capsule (20 mg total) by mouth daily. Patient not taking: Reported on 12/11/2018 12/05/16   Hedges, Dellis Filbert, PA-C  ondansetron (ZOFRAN ODT) 4 MG disintegrating tablet Take 1 tablet (4 mg total) by mouth every 8 (eight) hours as needed for nausea or vomiting. 07/08/19   Deno Etienne, DO    Allergies    Patient has no known allergies.  Review of Systems   Review of Systems  Constitutional: Negative for chills and fever.  HENT: Negative for congestion and rhinorrhea.   Eyes: Negative for redness and visual disturbance.  Respiratory: Negative for shortness of breath and wheezing.   Cardiovascular: Negative for chest pain and palpitations.  Gastrointestinal: Positive for abdominal pain, nausea and vomiting.  Genitourinary: Negative for dysuria and urgency.  Musculoskeletal: Negative for arthralgias and myalgias.  Skin: Negative for pallor and wound.  Neurological: Positive for weakness. Negative for dizziness and headaches.  Physical Exam Updated Vital Signs BP 117/60 (BP Location: Left Arm)   Pulse 66   Temp 97.9 F (36.6 C) (Oral)   Resp 20   Ht 5\' 2"  (1.575 m)   Wt 56.7 kg   LMP 06/05/2019   SpO2 100%   BMI 22.86 kg/m   Physical Exam Vitals and nursing note reviewed.  Constitutional:      General: She is not in acute distress.    Appearance: She is well-developed. She is not diaphoretic.  HENT:     Head: Normocephalic and atraumatic.  Eyes:     Pupils: Pupils are equal,  round, and reactive to light.  Cardiovascular:     Rate and Rhythm: Normal rate and regular rhythm.     Heart sounds: No murmur. No friction rub. No gallop.   Pulmonary:     Effort: Pulmonary effort is normal.     Breath sounds: No wheezing or rales.  Abdominal:     General: There is no distension.     Palpations: Abdomen is soft.     Tenderness: There is no abdominal tenderness.  Musculoskeletal:        General: No tenderness.     Cervical back: Normal range of motion and neck supple.  Skin:    General: Skin is warm and dry.  Neurological:     Mental Status: She is alert and oriented to person, place, and time.  Psychiatric:        Behavior: Behavior normal.     ED Results / Procedures / Treatments   Labs (all labs ordered are listed, but only abnormal results are displayed) Labs Reviewed  CBC - Abnormal; Notable for the following components:      Result Value   RDW 11.3 (*)    All other components within normal limits  LIPASE, BLOOD  COMPREHENSIVE METABOLIC PANEL  URINALYSIS, ROUTINE W REFLEX MICROSCOPIC  I-STAT BETA HCG BLOOD, ED ([facility], [facility], [facility] ONLY)    EKG EKG Interpretation  Date/Time:  Monday July 08 2019 09:02:11 EDT Ventricular Rate:  80 PR Interval:  152 QRS Duration: 76 QT Interval:  368 QTC Calculation: 424 R Axis:   80 Text Interpretation: Sinus rhythm with occasional Premature ventricular complexes Otherwise normal ECG background noise Otherwise no significant change Confirmed by 08-01-2002 [phone]) on 07/08/2019 9:14:31 AM   Radiology No results found.  Procedures Procedures (including critical care time)  Medications Ordered in ED Medications  sodium chloride 0.9 % bolus 1,000 mL (0 mLs Intravenous Stopped 07/08/19 1211)  ondansetron (ZOFRAN) injection 4 mg (4 mg Intravenous Given 07/08/19 1019)  alum & mag hydroxide-simeth (MAALOX/MYLANTA) 200-200-20 MG/5ML suspension 30 mL (30 mLs Oral Given 07/08/19 1019)    ED Course  I have reviewed the  triage vital signs and the nursing notes.  Pertinent labs & imaging results that were available during my care of the patient were reviewed by me and considered in my medical decision making (see chart for details).    MDM Rules/Calculators/A&P                      24 yo F with a chief complaints of nausea and vomiting.  Going on for about 3 days now.  No abdominal tenderness on my exam.  Will give a bolus of IV fluids Maalox Zofran and reassess.  Patient tolerating p.o. without difficulty.  Lab work without LFT elevation lipase is normal.  Discharge patient home.  1:33 PM:  I have discussed  the diagnosis/risks/treatment options with the patient and believe the pt to be eligible for discharge home to follow-up with PCP. We also discussed returning to the ED immediately if new or worsening sx occur. We discussed the sx which are most concerning (e.g., sudden worsening pain, fever, inability to tolerate by mouth) that necessitate immediate return. Medications administered to the patient during their visit and any new prescriptions provided to the patient are listed below.  Medications given during this visit Medications  sodium chloride 0.9 % bolus 1,000 mL (0 mLs Intravenous Stopped 07/08/19 1211)  ondansetron (ZOFRAN) injection 4 mg (4 mg Intravenous Given 07/08/19 1019)  alum & mag hydroxide-simeth (MAALOX/MYLANTA) 200-200-20 MG/5ML suspension 30 mL (30 mLs Oral Given 07/08/19 1019)     The patient appears reasonably screen and/or stabilized for discharge and I doubt any other medical condition or other [facility] requiring further screening, evaluation, or treatment in the ED at this time prior to discharge.   Final Clinical Impression(s) / ED Diagnoses Final diagnoses:  Nausea and vomiting in adult    Rx / DC Orders ED Discharge Orders         Ordered    ondansetron (ZOFRAN ODT) 4 MG disintegrating tablet  Every 8 hours PRN     07/08/19 1056           [place], DO 07/08/19 1333

## 2019-07-08 NOTE — ED Triage Notes (Signed)
Pt reports generalized weakness and emesis. States she fainted but unsure when. Pt a.o, nad noted,.

## 2019-07-08 NOTE — Discharge Instructions (Signed)
Try the [employer]  Return for worsening abdominal pain, fever or inability to eat or drink.

## 2019-12-19 ENCOUNTER — Emergency Department (HOSPITAL_COMMUNITY): Payer: Self-pay

## 2019-12-19 ENCOUNTER — Other Ambulatory Visit: Payer: Self-pay

## 2019-12-19 ENCOUNTER — Emergency Department (HOSPITAL_COMMUNITY)
Admission: EM | Admit: 2019-12-19 | Discharge: 2019-12-19 | Disposition: A | Discharge status: Home / Self Care | Payer: Self-pay | Attending: Emergency Medicine | Admitting: Emergency Medicine

## 2019-12-19 ENCOUNTER — Encounter (HOSPITAL_COMMUNITY): Payer: Self-pay

## 2019-12-19 DIAGNOSIS — R509 Fever, unspecified: Secondary | ICD-10-CM | POA: Insufficient documentation

## 2019-12-19 DIAGNOSIS — R0602 Shortness of breath: Secondary | ICD-10-CM | POA: Insufficient documentation

## 2019-12-19 DIAGNOSIS — Z20822 Contact with and (suspected) exposure to covid-19: Secondary | ICD-10-CM | POA: Insufficient documentation

## 2019-12-19 DIAGNOSIS — R000 Tachycardia, unspecified: Secondary | ICD-10-CM | POA: Insufficient documentation

## 2019-12-19 DIAGNOSIS — R0981 Nasal congestion: Secondary | ICD-10-CM | POA: Insufficient documentation

## 2019-12-19 DIAGNOSIS — I10 Essential (primary) hypertension: Secondary | ICD-10-CM | POA: Insufficient documentation

## 2019-12-19 DIAGNOSIS — Z87891 Personal history of nicotine dependence: Secondary | ICD-10-CM | POA: Insufficient documentation

## 2019-12-19 DIAGNOSIS — R05 Cough: Secondary | ICD-10-CM | POA: Insufficient documentation

## 2019-12-19 DIAGNOSIS — R519 Headache, unspecified: Secondary | ICD-10-CM | POA: Insufficient documentation

## 2019-12-19 DIAGNOSIS — R0789 Other chest pain: Secondary | ICD-10-CM | POA: Insufficient documentation

## 2019-12-19 DIAGNOSIS — R6889 Other general symptoms and signs: Secondary | ICD-10-CM

## 2019-12-19 DIAGNOSIS — J069 Acute upper respiratory infection, unspecified: Secondary | ICD-10-CM

## 2019-12-19 DIAGNOSIS — R079 Chest pain, unspecified: Secondary | ICD-10-CM

## 2019-12-19 DIAGNOSIS — Z79899 Other long term (current) drug therapy: Secondary | ICD-10-CM | POA: Insufficient documentation

## 2019-12-19 LAB — TROPONIN I (HIGH SENSITIVITY): Troponin I (High Sensitivity): <2 ng/L (ref <18)

## 2019-12-19 LAB — BASIC METABOLIC PANEL
Anion gap: 12 (ref 5–15)
BUN: 8 mg/dL (ref 6–20)
CO2: 24 mmol/L (ref 22–32)
Calcium: 9.2 mg/dL (ref 8.9–10.3)
Chloride: 100 mmol/L (ref 98–111)
Creatinine, Ser: 0.75 mg/dL (ref 0.44–1.00)
GFR calc Af Amer: >60 mL/min (ref >60)
GFR calc non Af Amer: >60 mL/min (ref >60)
Glucose, Bld: 84 mg/dL (ref 70–99)
Potassium: 4 mmol/L (ref 3.5–5.1)
Sodium: 136 mmol/L (ref 135–145)

## 2019-12-19 LAB — CBC
HCT: 41.3 % (ref 36.0–46.0)
Hemoglobin: 14.1 g/dL (ref 12.0–15.0)
MCH: 30 pg (ref 26.0–34.0)
MCHC: 34.1 g/dL (ref 30.0–36.0)
MCV: 87.9 fL (ref 80.0–100.0)
Platelets: 262 10*3/uL (ref 150–400)
RBC: 4.7 MIL/uL (ref 3.87–5.11)
RDW: 11.2 % — ABNORMAL LOW (ref 11.5–15.5)
WBC: 8.6 10*3/uL (ref 4.0–10.5)
nRBC: 0 % (ref 0.0–0.2)

## 2019-12-19 LAB — I-STAT BETA HCG BLOOD, ED (NOT ORDERABLE): I-stat hCG, quantitative: <5 m[IU]/mL (ref <5)

## 2019-12-19 LAB — SARS CORONAVIRUS 2 BY RT PCR (HOSPITAL ORDER, PERFORMED IN ~~LOC~~ HOSPITAL LAB): SARS Coronavirus 2: NEGATIVE

## 2019-12-19 LAB — GROUP A STREP BY PCR: Group A Strep by PCR: NOT DETECTED

## 2019-12-19 LAB — D-DIMER, QUANTITATIVE: D-Dimer, Quant: 0.95 ug/mL-FEU — ABNORMAL HIGH (ref 0.00–0.50)

## 2019-12-19 MED ORDER — SODIUM CHLORIDE (PF) 0.9 % IJ SOLN
INTRAMUSCULAR | Status: AC
  Filled 2019-12-19: qty 50

## 2019-12-19 MED ORDER — IOHEXOL 350 MG/ML SOLN
100.0000 mL | Freq: Once | INTRAVENOUS | Status: AC | PRN
  Administered 2019-12-19: 100 mL via INTRAVENOUS

## 2019-12-19 NOTE — ED Triage Notes (Signed)
Pt presents with c/o chills, chest pain, and a headache. Pt reports her headache started 2 days ago and the chest pain started in the middle of the night last night. Pt denies any recent Covid exposure or fevers.

## 2019-12-19 NOTE — ED Provider Notes (Signed)
Patient signed to me or Dr. Myrtis Ser pending work-up here.  Covid test negative.  D-dimer elevated and subsequent chest CT negative.  Strep test negative.  Suspect viral illness and will discharge home.  Patient comfortable with this plan   Lorre Nick, MD 12/19/19 1727

## 2019-12-19 NOTE — ED Provider Notes (Signed)
[facility]-EMERGENCY DEPT Provider Note   CSN: [id number] Arrival date & time: 12/19/19  1638     History Chief Complaint  Patient presents with  . Headache  . Chills  . Chest Pain    Marilyn Ramirez is a 24 y.o. female.  Flulike illness to include shortness of breath congestion runny nose cough headache.  All symptoms mild, for the last several days.  Not improving.  No sick contacts.  Not vaccinated for Covid.  No sick exposures.  Works in Audiological scientist.  May be many sick children that she is interacting with.  Otherwise healthy with no chronic medical problems  The history is provided by the patient.       Past Medical History:  Diagnosis Date  . Osteoarthritis of knee   . Ovarian cyst   . Scoliosis     Patient Active Problem List   Diagnosis Date Noted  . Low back pain 03/14/2011  . HYPERTENSION 09/17/2007  . SKIN RASH 09/17/2007  . PROTEINURIA 09/17/2007  . DERMATITIS, ATOPIC 09/20/2006    History reviewed. No pertinent surgical history.   OB History   No obstetric history on file.     History reviewed. No pertinent family history.  Social History   Tobacco Use  . Smoking status: Former Games developer  . Smokeless tobacco: Never Used  Substance Use Topics  . Alcohol use: No  . Drug use: No    Home Medications Prior to Admission medications   Medication Sig Start Date End Date Taking? Authorizing Provider  methocarbamol (ROBAXIN) 500 MG tablet Take 1 tablet (500 mg total) by mouth at bedtime as needed for muscle spasms. Patient not taking: Reported on 12/11/2018 09/07/17   Caccavale, Sophia, PA-C  naproxen (NAPROSYN) 500 MG tablet Take 1 tablet (500 mg total) by mouth 2 (two) times daily. 12/11/18   Linwood Dibbles, MD  omeprazole (PRILOSEC) 20 MG capsule Take 1 capsule (20 mg total) by mouth daily. Patient not taking: Reported on 12/11/2018 12/05/16   Hedges, Tinnie Gens, PA-C  ondansetron (ZOFRAN ODT) 4 MG disintegrating tablet Take 1 tablet (4 mg  total) by mouth every 8 (eight) hours as needed for nausea or vomiting. 07/08/19   Melene Plan, DO    Allergies    Patient has no known allergies.  Review of Systems   Review of Systems  Constitutional: Positive for activity change, appetite change, chills and fever (subjective).  HENT: Positive for congestion and rhinorrhea. Negative for dental problem.   Respiratory: Positive for cough, chest tightness and shortness of breath.   Cardiovascular: Negative for chest pain and palpitations.  Gastrointestinal: Negative for diarrhea, nausea and vomiting.  Genitourinary: Negative for difficulty urinating and dysuria.  Musculoskeletal: Negative for arthralgias and back pain.  Skin: Negative for rash and wound.  Neurological: Positive for headaches. Negative for light-headedness.    Physical Exam Updated Vital Signs BP 123/76 (BP Location: Left Arm)   Pulse (!) 113   Temp 98.3 F (36.8 C) (Oral)   Resp 18   Ht 5\' 2"  (1.575 m)   Wt 55.8 kg   LMP 12/08/2019 (Approximate)   SpO2 100%   BMI 22.50 kg/m   Physical Exam Vitals and nursing note reviewed. Exam conducted with a chaperone present.  Constitutional:      General: She is not in acute distress.    Appearance: Normal appearance.  HENT:     Head: Normocephalic and atraumatic.     Right Ear: Tympanic membrane normal.  Left Ear: Tympanic membrane normal.     Nose: No rhinorrhea.     Mouth/Throat:     Mouth: Mucous membranes are moist.     Pharynx: Posterior oropharyngeal erythema present.     Comments: Palate petechiae  Eyes:     General:        Right eye: No discharge.        Left eye: No discharge.     Conjunctiva/sclera: Conjunctivae normal.  Cardiovascular:     Rate and Rhythm: Regular rhythm. Tachycardia present.  Pulmonary:     Effort: Pulmonary effort is normal. No respiratory distress.     Breath sounds: No stridor.  Abdominal:     General: Abdomen is flat. There is no distension.     Palpations: Abdomen is  soft.     Tenderness: There is no abdominal tenderness.  Musculoskeletal:        General: No tenderness or signs of injury.     Cervical back: Normal range of motion. No rigidity.  Skin:    General: Skin is warm and dry.  Neurological:     General: No focal deficit present.     Mental Status: She is alert. Mental status is at baseline.     Motor: No weakness.  Psychiatric:        Mood and Affect: Mood normal.        Behavior: Behavior normal.     ED Results / Procedures / Treatments   Labs (all labs ordered are listed, but only abnormal results are displayed) Labs Reviewed  CBC - Abnormal; Notable for the following components:      Result Value   RDW 11.2 (*)    All other components within normal limits  GROUP A STREP BY PCR  SARS CORONAVIRUS 2 BY RT PCR (HOSPITAL ORDER, PERFORMED IN [facility])  BASIC METABOLIC PANEL  D-DIMER, QUANTITATIVE (NOT AT [facility])  I-STAT BETA HCG BLOOD, ED ([facility], [facility], [facility] ONLY)  I-STAT BETA HCG BLOOD, ED (NOT ORDERABLE)  TROPONIN I (HIGH SENSITIVITY)  TROPONIN I (HIGH SENSITIVITY)    EKG None  Radiology DG Chest 2 View  Result Date: 12/19/2019 CLINICAL DATA:  Chest pain, chills and headache EXAM: CHEST - 2 VIEW COMPARISON:  03/15/2015 FINDINGS: Trachea is midline. Cardiomediastinal contours and hilar structures are normal. Lungs are clear. On limited assessment visualized skeletal structures without acute process. IMPRESSION: No acute cardiopulmonary disease. Electronically Signed   By: Donzetta Kohut M.D.   On: 12/19/2019 09:27    Procedures Procedures (including critical care time)  Medications Ordered in ED Medications - No data to display  ED Course  I have reviewed the triage vital signs and the nursing notes.  Pertinent labs & imaging results that were available during my care of the patient were reviewed by me and considered in my medical decision making (see chart for details).  Clinical Course as of Dec 19 1551  Thu  Dec 19, 2019  1411 DG Chest 2 View [EK]    Clinical Course User Index [EK] Sabino Donovan, MD   MDM Rules/Calculators/A&P                         Pain, seen in triage, EKG done, troponin negative, other labs unremarkable.  Chest x-ray shows no acute cardiopulmonary pathology after reviewed by radiology myself.  D-dimer will be sent as we cannot do his PERC.  She will get a strep swab as well.  Covid swab as well.  Overall well-appearing well-hydrated normal work of breathing safe for discharge home pending results.  Troponin is negative.  EKG is stable.  Chest x-ray reviewed by myself and radiology shows no acute findings.  Still waiting for swabs to include Covid and strep.  D-dimer still pending.  If all is negative she is discharged home with viral symptoms.  Pt care was handed off to on coming provider at 1530.  Complete history and physical and current plan have been communicated.  Please refer to their note for the remainder of ED care and ultimate disposition.  Pt seen in conjunction with Dr. Freida Busman     Final Clinical Impression(s) / ED Diagnoses Final diagnoses:  Chest pain, unspecified type  Flu-like symptoms    Rx / DC Orders ED Discharge Orders    None       Sabino Donovan, MD 12/19/19 1553

## 2020-11-17 ENCOUNTER — Ambulatory Visit: Payer: Medicaid Other | Attending: Critical Care Medicine

## 2020-11-17 DIAGNOSIS — Z20822 Contact with and (suspected) exposure to covid-19: Secondary | ICD-10-CM

## 2020-11-18 LAB — SARS-COV-2, NAA 2 DAY TAT

## 2020-11-18 LAB — NOVEL CORONAVIRUS, NAA: SARS-CoV-2, NAA: NOT DETECTED

## 2021-01-16 IMAGING — CT CT ANGIO CHEST
2 of 6 series · 18 of 36 positions shown · IV contrast (omnipaque)
Comparison: Radiograph earlier today.

CLINICAL DATA: PE suspected, high prob

Elevated D-dimer.  Flu like symptoms.  Chest pain.
EXAM:
CT ANGIOGRAPHY CHEST WITH CONTRAST
TECHNIQUE: Multidetector CT imaging of the chest was performed using the
standard protocol during bolus administration of intravenous
contrast. Multiplanar CT image reconstructions and MIPs were
obtained to evaluate the vascular anatomy.
CONTRAST:  100mL OMNIPAQUE IOHEXOL 350 MG/ML SOLN

[Series 6: thins · axial · 0.62mm/px · z∈[-239,-15]mm · 17 of 254 slices shown]
[im 15/254  lung]
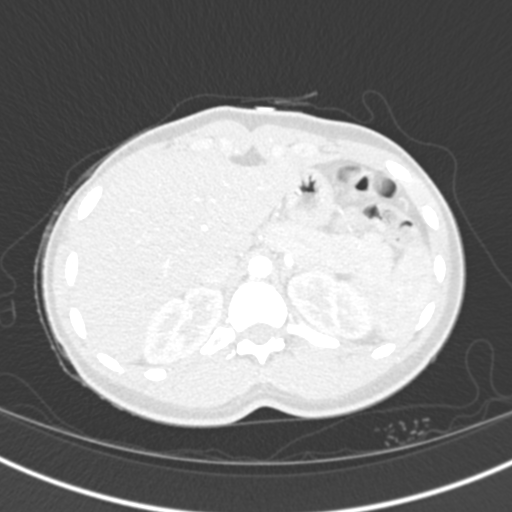
[im 29/254  mediastinal]
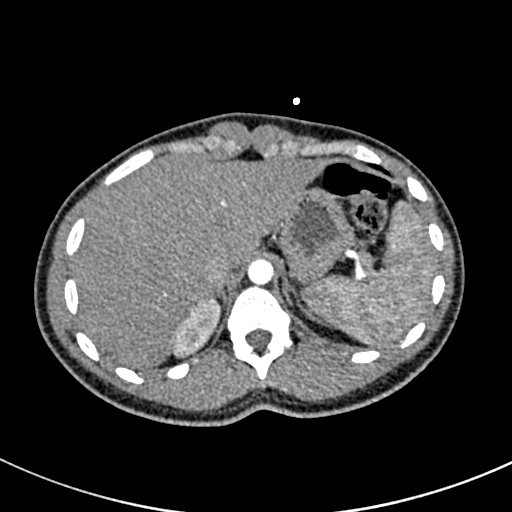
[im 43/254  lung]
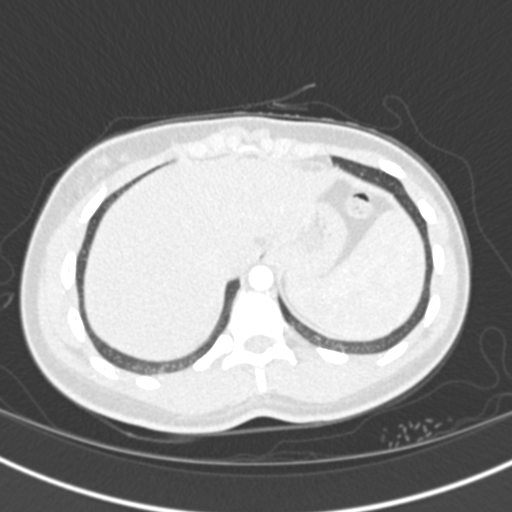
[im 57/254  mediastinal]
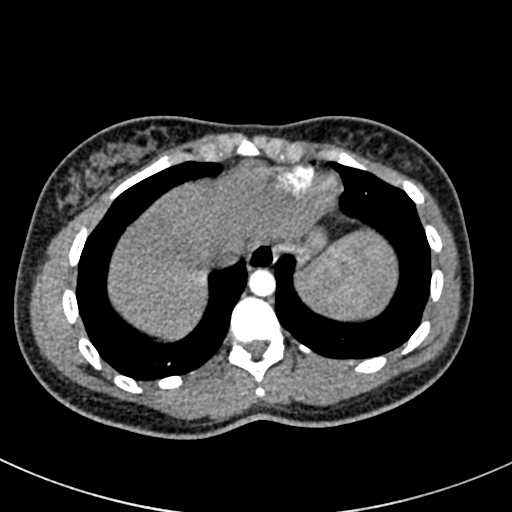
[im 71/254  lung]
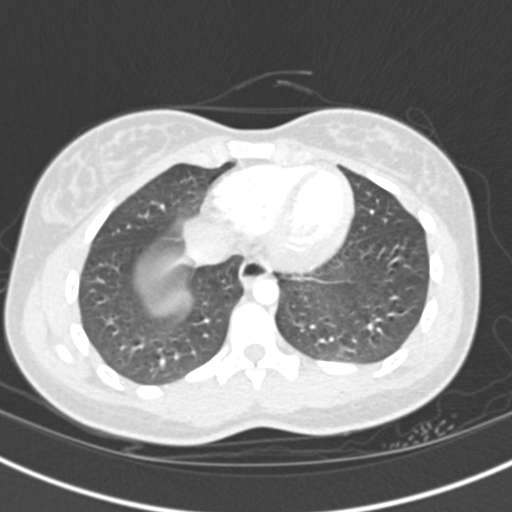
[im 85/254  mediastinal]
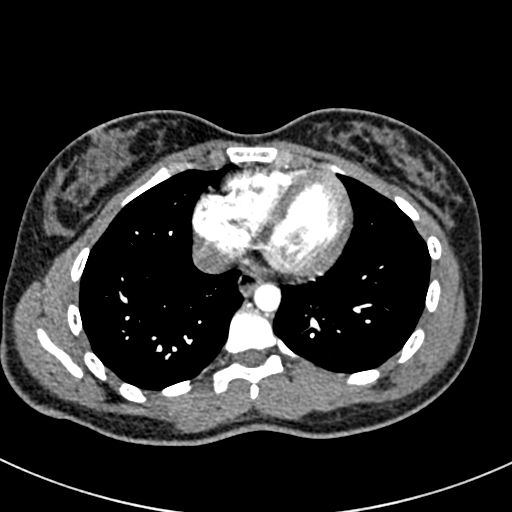
[im 99/254  lung]
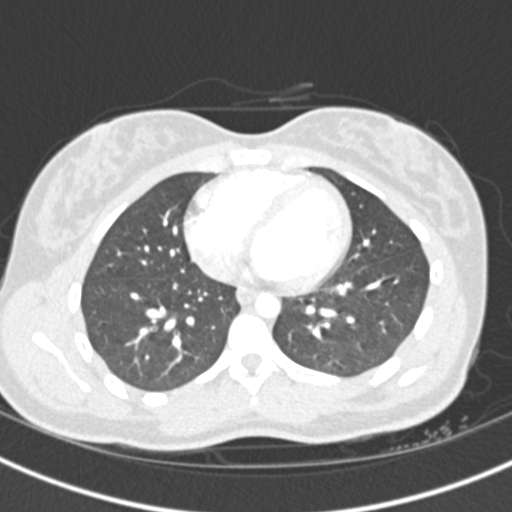
[im 113/254  mediastinal]
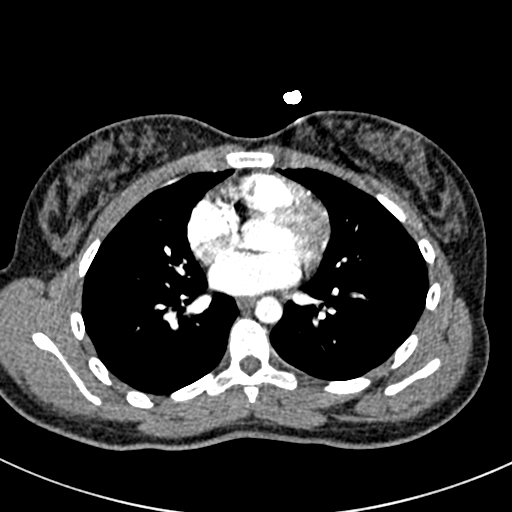
[im 127/254  lung]
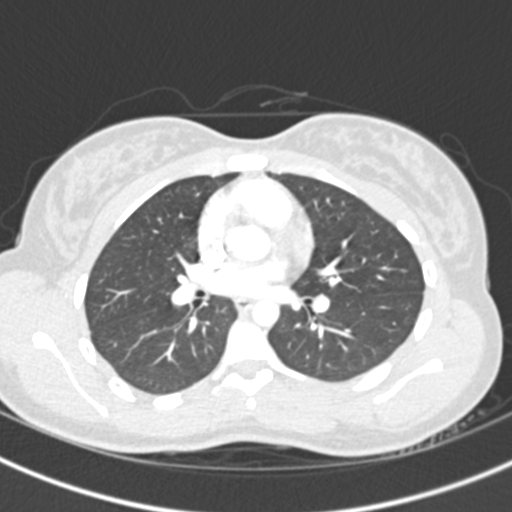
[im 141/254  mediastinal]
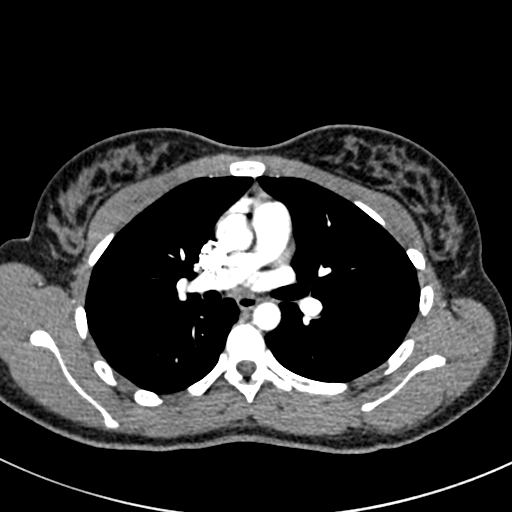
[im 155/254  lung]
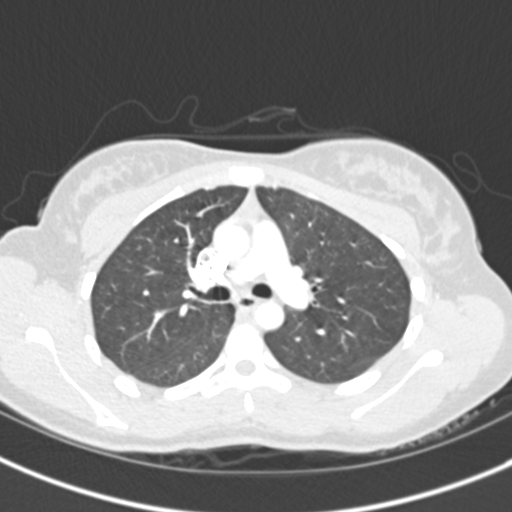
[im 169/254  mediastinal]
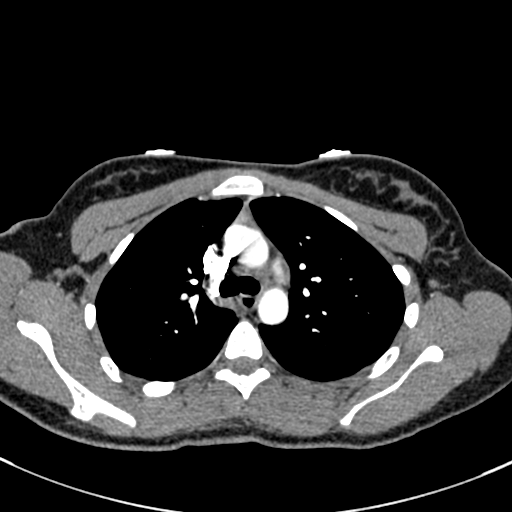
[im 183/254  lung]
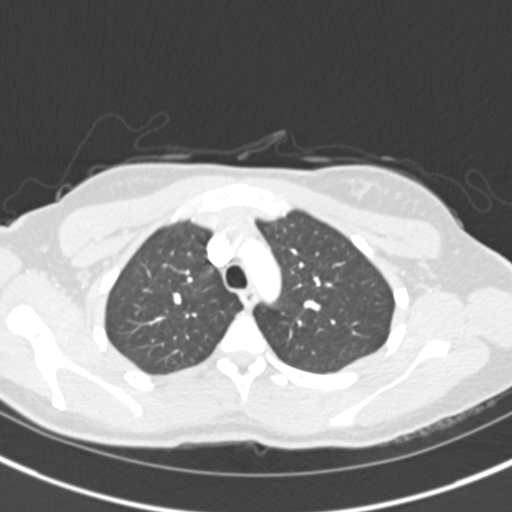
[im 197/254  mediastinal]
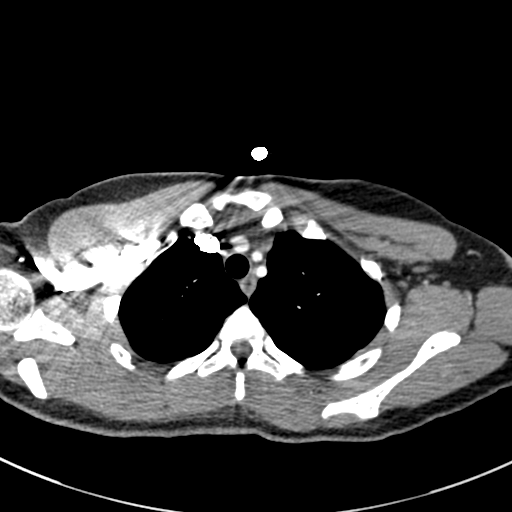
[im 211/254  lung]
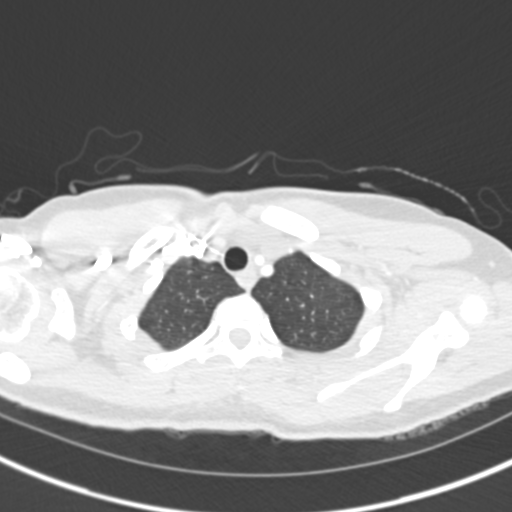
[im 225/254  mediastinal]
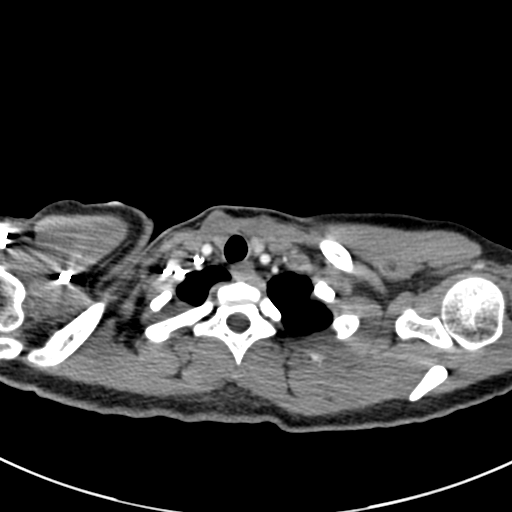
[im 239/254  lung]
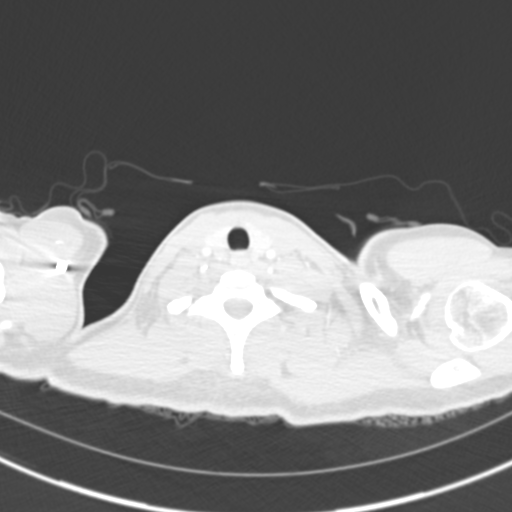

[Series 7: coronal mpr · coronal · 0.54mm/px · 1 of 101 slices shown]
[im 51/101  mediastinal]
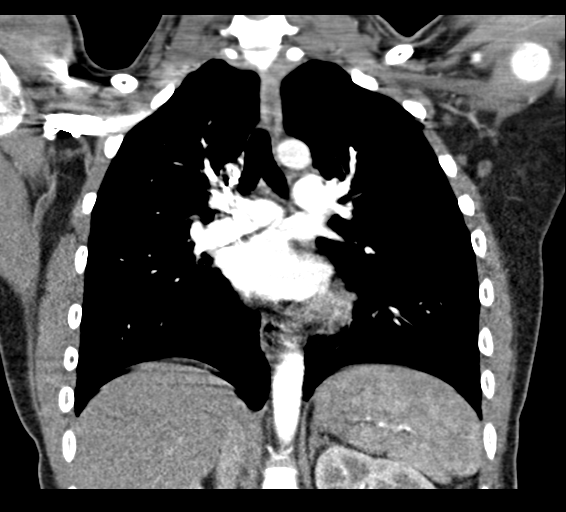

[18 of 36 positions shown; findings below may reference images not displayed]

FINDINGS: Cardiovascular: There are no filling defects within the pulmonary
arteries to suggest pulmonary embolus. Breathing motion artifact
partially obscures basilar assessment. The thoracic aorta is normal
in caliber. No aortic dissection. Conventional branching pattern
from the aortic arch. The heart is normal in size. No pericardial
effusion.

Mediastinum/Nodes: Patulous distal esophagus. No esophageal wall
thickening. No enlarged mediastinal or hilar lymph nodes. Minimal
triangular soft tissue density in the anterior mediastinum typical
of residual thymus. No visualized thyroid nodule.

Lungs/Pleura: The lungs are clear. No focal or ground-glass opacity.
No findings of pulmonary edema. No pleural fluid. Slight motion
artifact limitations at the lung bases. There is no pulmonary mass.

Upper Abdomen: No acute or unexpected findings.

Musculoskeletal: No chest wall abnormality. No acute or significant
osseous findings.

Review of the MIP images confirms the above findings.
IMPRESSION: No pulmonary embolus or acute intrathoracic abnormality.

## 2021-06-11 ENCOUNTER — Emergency Department (HOSPITAL_BASED_OUTPATIENT_CLINIC_OR_DEPARTMENT_OTHER): Payer: BC Managed Care – PPO

## 2021-06-11 ENCOUNTER — Encounter (HOSPITAL_BASED_OUTPATIENT_CLINIC_OR_DEPARTMENT_OTHER): Payer: Self-pay | Admitting: Emergency Medicine

## 2021-06-11 ENCOUNTER — Other Ambulatory Visit: Payer: Self-pay

## 2021-06-11 ENCOUNTER — Emergency Department (HOSPITAL_BASED_OUTPATIENT_CLINIC_OR_DEPARTMENT_OTHER)
Admission: EM | Admit: 2021-06-11 | Discharge: 2021-06-12 | Disposition: A | Discharge status: Home / Self Care | Payer: BC Managed Care – PPO | Attending: Emergency Medicine | Admitting: Emergency Medicine

## 2021-06-11 DIAGNOSIS — R110 Nausea: Secondary | ICD-10-CM | POA: Insufficient documentation

## 2021-06-11 DIAGNOSIS — R1012 Left upper quadrant pain: Secondary | ICD-10-CM

## 2021-06-11 DIAGNOSIS — N72 Inflammatory disease of cervix uteri: Secondary | ICD-10-CM | POA: Insufficient documentation

## 2021-06-11 DIAGNOSIS — R748 Abnormal levels of other serum enzymes: Secondary | ICD-10-CM | POA: Insufficient documentation

## 2021-06-11 LAB — CBC
HCT: 41.8 % (ref 36.0–46.0)
Hemoglobin: 14.3 g/dL (ref 12.0–15.0)
MCH: 29.9 pg (ref 26.0–34.0)
MCHC: 34.2 g/dL (ref 30.0–36.0)
MCV: 87.3 fL (ref 80.0–100.0)
Platelets: 292 10*3/uL (ref 150–400)
RBC: 4.79 MIL/uL (ref 3.87–5.11)
RDW: 11.4 % — ABNORMAL LOW (ref 11.5–15.5)
WBC: 3.5 10*3/uL — ABNORMAL LOW (ref 4.0–10.5)
nRBC: 0 % (ref 0.0–0.2)

## 2021-06-11 LAB — URINALYSIS, ROUTINE W REFLEX MICROSCOPIC
Bilirubin Urine: NEGATIVE
Glucose, UA: NEGATIVE mg/dL
Hgb urine dipstick: NEGATIVE
Ketones, ur: NEGATIVE mg/dL
Leukocytes,Ua: NEGATIVE
Nitrite: NEGATIVE
Protein, ur: NEGATIVE mg/dL
Specific Gravity, Urine: 1.02 (ref 1.005–1.030)
pH: 7.5 (ref 5.0–8.0)

## 2021-06-11 LAB — COMPREHENSIVE METABOLIC PANEL
ALT: 16 U/L (ref 0–44)
AST: 21 U/L (ref 15–41)
Albumin: 4.5 g/dL (ref 3.5–5.0)
Alkaline Phosphatase: 58 U/L (ref 38–126)
Anion gap: 6 (ref 5–15)
BUN: 11 mg/dL (ref 6–20)
CO2: 26 mmol/L (ref 22–32)
Calcium: 8.7 mg/dL — ABNORMAL LOW (ref 8.9–10.3)
Chloride: 101 mmol/L (ref 98–111)
Creatinine, Ser: 0.83 mg/dL (ref 0.44–1.00)
GFR, Estimated: >60 mL/min (ref >60)
Glucose, Bld: 89 mg/dL (ref 70–99)
Potassium: 3.1 mmol/L — ABNORMAL LOW (ref 3.5–5.1)
Sodium: 133 mmol/L — ABNORMAL LOW (ref 135–145)
Total Bilirubin: 0.2 mg/dL — ABNORMAL LOW (ref 0.3–1.2)
Total Protein: 8.6 g/dL — ABNORMAL HIGH (ref 6.5–8.1)

## 2021-06-11 LAB — LIPASE, BLOOD: Lipase: 55 U/L — ABNORMAL HIGH (ref 11–51)

## 2021-06-11 LAB — PREGNANCY, URINE: Preg Test, Ur: NEGATIVE

## 2021-06-11 MED ORDER — SODIUM CHLORIDE 0.9 % IV BOLUS
500.0000 mL | Freq: Once | INTRAVENOUS | Status: AC
  Administered 2021-06-12: 500 mL via INTRAVENOUS

## 2021-06-11 MED ORDER — ONDANSETRON HCL 4 MG/2ML IJ SOLN
4.0000 mg | Freq: Once | INTRAMUSCULAR | Status: AC
  Administered 2021-06-11: 4 mg via INTRAVENOUS
  Filled 2021-06-11: qty 2

## 2021-06-11 MED ORDER — IOHEXOL 300 MG/ML  SOLN
100.0000 mL | Freq: Once | INTRAMUSCULAR | Status: AC | PRN
  Administered 2021-06-11: 100 mL via INTRAVENOUS

## 2021-06-11 NOTE — ED Triage Notes (Signed)
Left side abdominal pain that radiates to back. Has HX of cyst on ovary but didn't follow up. Sx for the last 2 weeks. Has not had a period in 3 months but has negative preg at urgent care.

## 2021-06-11 NOTE — ED Notes (Signed)
Patient transported to CT 

## 2021-06-11 NOTE — ED Provider Notes (Signed)
[facility] EMERGENCY DEPARTMENT Provider Note   CSN: [id number] Arrival date & time: 06/11/21  2233     History  Chief Complaint  Patient presents with   Abdominal Pain    Marilyn Ramirez is a 26 y.o. female.  Patient presents to the emergency department for evaluation of left-sided abdominal pain.  Symptoms have been present for about a week.  Patient reports that the pain moves around.  It goes from the left upper abdomen to the left lower abdomen.  No pain on the right side.  She has had associated nausea, no vomiting.  She feels bloated and has increased flatulence, no diarrhea or constipation.  Denies urinary symptoms.  No pelvic pain, vaginal bleeding or discharge.      Home Medications Prior to Admission medications   Medication Sig Start Date End Date Taking? Authorizing Provider  methocarbamol (ROBAXIN) 500 MG tablet Take 1 tablet (500 mg total) by mouth at bedtime as needed for muscle spasms. Patient not taking: Reported on 12/11/2018 09/07/17   Caccavale, Sophia, PA-C  naproxen (NAPROSYN) 500 MG tablet Take 1 tablet (500 mg total) by mouth 2 (two) times daily. 12/11/18   Linwood Dibbles, MD  omeprazole (PRILOSEC) 20 MG capsule Take 1 capsule (20 mg total) by mouth daily. Patient not taking: Reported on 12/11/2018 12/05/16   Hedges, Tinnie Gens, PA-C  ondansetron (ZOFRAN ODT) 4 MG disintegrating tablet Take 1 tablet (4 mg total) by mouth every 8 (eight) hours as needed for nausea or vomiting. 07/08/19   Melene Plan, DO      Allergies    Patient has no known allergies.    Review of Systems   Review of Systems  Gastrointestinal:  Positive for abdominal pain.   Physical Exam Updated Vital Signs BP 113/82 (BP Location: Left Arm)    Pulse 93    Temp 99.2 F (37.3 C)    Resp 18    Ht 5' (1.524 m)    Wt 61.2 kg    LMP 03/11/2021    SpO2 100%    BMI 26.37 kg/m  Physical Exam Vitals and nursing note reviewed.  Constitutional:      General: She is not in acute distress.     Appearance: She is well-developed.  HENT:     Head: Normocephalic and atraumatic.     Mouth/Throat:     Mouth: Mucous membranes are moist.  Eyes:     General: Vision grossly intact. Gaze aligned appropriately.     Extraocular Movements: Extraocular movements intact.     Conjunctiva/sclera: Conjunctivae normal.  Cardiovascular:     Rate and Rhythm: Normal rate and regular rhythm.     Pulses: Normal pulses.     Heart sounds: Normal heart sounds, S1 normal and S2 normal. No murmur heard.   No friction rub. No gallop.  Pulmonary:     Effort: Pulmonary effort is normal. No respiratory distress.     Breath sounds: Normal breath sounds.  Abdominal:     General: Bowel sounds are normal.     Palpations: Abdomen is soft.     Tenderness: There is abdominal tenderness in the left upper quadrant and left lower quadrant. There is no guarding or rebound.     Hernia: No hernia is present.  Musculoskeletal:        General: No swelling.     Cervical back: Full passive range of motion without pain, normal range of motion and neck supple. No spinous process tenderness or muscular tenderness. Normal range  of motion.     Right lower leg: No edema.     Left lower leg: No edema.  Skin:    General: Skin is warm and dry.     Capillary Refill: Capillary refill takes less than 2 seconds.     Findings: No ecchymosis, erythema, rash or wound.  Neurological:     General: No focal deficit present.     Mental Status: She is alert and oriented to person, place, and time.     GCS: GCS eye subscore is 4. GCS verbal subscore is 5. GCS motor subscore is 6.     Cranial Nerves: Cranial nerves 2-12 are intact.     Sensory: Sensation is intact.     Motor: Motor function is intact.     Coordination: Coordination is intact.  Psychiatric:        Attention and Perception: Attention normal.        Mood and Affect: Mood normal.        Speech: Speech normal.        Behavior: Behavior normal.    ED Results / Procedures  / Treatments   Labs (all labs ordered are listed, but only abnormal results are displayed) Labs Reviewed  LIPASE, BLOOD - Abnormal; Notable for the following components:      Result Value   Lipase 55 (*)    All other components within normal limits  COMPREHENSIVE METABOLIC PANEL - Abnormal; Notable for the following components:   Sodium 133 (*)    Potassium 3.1 (*)    Calcium 8.7 (*)    Total Protein 8.6 (*)    Total Bilirubin 0.2 (*)    All other components within normal limits  CBC - Abnormal; Notable for the following components:   WBC 3.5 (*)    RDW 11.4 (*)    All other components within normal limits  WET PREP, GENITAL  URINALYSIS, ROUTINE W REFLEX MICROSCOPIC  PREGNANCY, URINE  GC/CHLAMYDIA PROBE AMP ([facility]) NOT AT [facility]    EKG None  Radiology CT ABDOMEN PELVIS W CONTRAST  Result Date: 06/12/2021 CLINICAL DATA:  Left-sided abdominal pain for 2 weeks, history of prior left ovarian cyst EXAM: CT ABDOMEN AND PELVIS WITH CONTRAST TECHNIQUE: Multidetector CT imaging of the abdomen and pelvis was performed using the standard protocol following bolus administration of intravenous contrast. RADIATION DOSE REDUCTION: This exam was performed according to the departmental dose-optimization program which includes automated exposure control, adjustment of the mA and/or kV according to patient size and/or use of iterative reconstruction technique. CONTRAST:  OMNIPAQUE IOHEXOL 300 MG/ML  SOLN COMPARISON:  05/02/2015 FINDINGS: Lower chest: No acute abnormality. Hepatobiliary: No focal liver abnormality is seen. No gallstones, gallbladder wall thickening, or biliary dilatation. Pancreas: Unremarkable. No pancreatic ductal dilatation or surrounding inflammatory changes. Spleen: Normal in size without focal abnormality. Adrenals/Urinary Tract: Adrenal glands are within normal limits. Kidneys demonstrate a normal enhancement pattern bilaterally. Normal excretion is seen bilaterally. No  renal calculi or obstructive changes are noted. Bladder is partially distended. Stomach/Bowel: No obstructive or inflammatory changes of the colon are seen. Mild retained fecal material is noted. The appendix is within normal limits. No small bowel or gastric abnormality is seen. Vascular/Lymphatic: No significant vascular findings are present. No enlarged abdominal or pelvic lymph nodes. Reproductive: Uterus is within normal limits. No adnexal mass is seen. Other: No abdominal wall hernia or abnormality. No abdominopelvic ascites. Musculoskeletal: No acute or significant osseous findings. IMPRESSION: No acute abnormality noted. Electronically Signed  By: Alcide Clever M.D.   On: 06/12/2021 00:05    Procedures Procedures    Medications Ordered in ED Medications  sodium chloride 0.9 % bolus 500 mL (0 mLs Intravenous Stopped 06/12/21 0030)  ondansetron (ZOFRAN) injection 4 mg (4 mg Intravenous Given 06/11/21 2349)  iohexol (OMNIPAQUE) 300 MG/ML solution 100 mL (100 mLs Intravenous Contrast Given 06/11/21 2351)    ED Course/ Medical Decision Making/ A&P                           Medical Decision Making Amount and/or Complexity of Data Reviewed Labs: ordered. Radiology: ordered.  Risk Prescription drug management.   Patient presents with complaints of abdominal pain.  Patient reports that symptoms have been ongoing for about a week.  Pain seems to be waxing and waning.  She has had nausea but no vomiting.  Patient feels "gassy".  Differential diagnosis would be GERD, gastritis, peptic ulcer disease, gallbladder disease, pancreatitis, intestinal pathology, pregnancy, ovarian cyst, PID.  Patient's lab work was reassuring.  No elevation of the white blood cell count.  Remainder of labs are unremarkable.  Nonspecific minimal lipase elevation of 55.  Urinalysis without infection.  No pregnancy.  Patient reports a history of ovarian cyst. CT scan was normal.  No cyst seen on scan.  Pelvic exam with  white cervical discharge, cervical motion tenderness, no adnexal masses.  Treat empirically for cervicitis.  Cultures obtained.  With left upper quadrant pain and tenderness, consider abdominal pathology.  Will  treat patient with Protonix.        Final Clinical Impression(s) / ED Diagnoses Final diagnoses:  Left upper quadrant abdominal pain  Cervicitis    Rx / DC Orders ED Discharge Orders     None         Gilda Crease, MD 06/12/21 [id number]

## 2021-06-12 LAB — WET PREP, GENITAL
Sperm: NONE SEEN
Trich, Wet Prep: NONE SEEN
WBC, Wet Prep HPF POC: >=10 — AB (ref <10)

## 2021-06-12 MED ORDER — DOXYCYCLINE HYCLATE 100 MG PO CAPS: 100.0000 mg | ORAL_CAPSULE | Freq: Two times a day (BID) | ORAL | 0 refills | Status: DC

## 2021-06-12 MED ORDER — FLUCONAZOLE 150 MG PO TABS: 150.0000 mg | ORAL_TABLET | ORAL | 0 refills | Status: AC

## 2021-06-12 MED ORDER — PANTOPRAZOLE SODIUM 20 MG PO TBEC: 20.0000 mg | DELAYED_RELEASE_TABLET | Freq: Every day | ORAL | 0 refills | Status: DC

## 2021-06-12 MED ORDER — SODIUM CHLORIDE 0.9 % IV SOLN
1.0000 g | Freq: Once | INTRAVENOUS | Status: AC
  Administered 2021-06-12: 1 g via INTRAVENOUS
  Filled 2021-06-12: qty 10

## 2021-06-12 MED ORDER — METRONIDAZOLE 500 MG PO TABS: 500.0000 mg | ORAL_TABLET | Freq: Two times a day (BID) | ORAL | 0 refills | Status: DC

## 2021-06-14 LAB — GC/CHLAMYDIA PROBE AMP (~~LOC~~) NOT AT ARMC
Chlamydia: NEGATIVE
Comment: NEGATIVE
Comment: NORMAL
Neisseria Gonorrhea: NEGATIVE

## 2022-05-23 ENCOUNTER — Encounter (HOSPITAL_COMMUNITY): Payer: Self-pay | Admitting: Emergency Medicine

## 2022-05-23 ENCOUNTER — Ambulatory Visit (HOSPITAL_COMMUNITY)
Admission: EM | Admit: 2022-05-23 | Discharge: 2022-05-23 | Disposition: A | Discharge status: Home / Self Care | Payer: BC Managed Care – PPO

## 2022-05-23 DIAGNOSIS — L299 Pruritus, unspecified: Secondary | ICD-10-CM

## 2022-05-23 NOTE — ED Triage Notes (Signed)
Pt reports bilat under eye itching since last week. Reports some watering to left eye.

## 2022-05-23 NOTE — Discharge Instructions (Signed)
Purchase Zyrtec and take this over-the-counter 10 mg at bedtime daily for the next 1 to 2 weeks to see if this helps with itching.   You may also purchase Pataday eyedrops (olopatadine) and use these as directed to help with itching to the eyes.  If you develop any new or worsening symptoms or do not improve in the next 2 to 3 days, please return.  If your symptoms are severe, please go to the emergency room.  Follow-up with your primary care provider for further evaluation and management of your symptoms as well as ongoing wellness visits.  I hope you feel better!

## 2022-05-23 NOTE — ED Provider Notes (Signed)
[place]    CSN: [id number] Arrival date & time: 05/23/22  1926      History   Chief Complaint Chief Complaint  Patient presents with   Pruritis    HPI Marilyn Ramirez is a 27 y.o. female.   Patient presents to urgent care for evaluation of itching to the maxillary facial area and "slight clear drainage" from the left eye that started last week. No rash, redness, breaks to the skin, recent new makeup products, recent new laundry detergents, or viral URI symptoms. She uses Cetaphil to the face daily but denies use of other creams or lotions to the face. Denies eye redness, blurry vision, eye itching, swelling to the face, and eye crusting in the mornings. No recent ingestion of foods or drinks outside of normal diet. Denies rhinorrhea, sore throat, fever/chills, neck pain, and dizziness. She has not attempted use of any over the counter medications to help with symptoms before coming to urgent care.      Past Medical History:  Diagnosis Date   Osteoarthritis of knee    Ovarian cyst    Scoliosis     Patient Active Problem List   Diagnosis Date Noted   Low back pain 03/14/2011   HYPERTENSION 09/17/2007   SKIN RASH 09/17/2007   PROTEINURIA 09/17/2007   DERMATITIS, ATOPIC 09/20/2006    History reviewed. No pertinent surgical history.  OB History   No obstetric history on file.      Home Medications    Prior to Admission medications   Medication Sig Start Date End Date Taking? Authorizing Provider  doxycycline (VIBRAMYCIN) 100 MG capsule Take 1 capsule (100 mg total) by mouth 2 (two) times daily. 06/12/21   Orpah Greek, MD  metroNIDAZOLE (FLAGYL) 500 MG tablet Take 1 tablet (500 mg total) by mouth 2 (two) times daily. One po bid x 7 days 06/12/21   Orpah Greek, MD  pantoprazole (PROTONIX) 20 MG tablet Take 1 tablet (20 mg total) by mouth daily. 06/12/21   Orpah Greek, MD    Family History No family history on  file.  Social History Social History   Tobacco Use   Smoking status: Former   Smokeless tobacco: Never  Substance Use Topics   Alcohol use: No   Drug use: No     Allergies   Patient has no known allergies.   Review of Systems Review of Systems Per HPI  Physical Exam Triage Vital Signs ED Triage Vitals  Enc Vitals Group     BP 05/23/22 1946 108/74     Pulse Rate 05/23/22 1946 69     Resp 05/23/22 1946 15     Temp 05/23/22 1946 98 F (36.7 C)     Temp Source 05/23/22 1946 Oral     SpO2 05/23/22 1946 98 %     Weight --      Height --      Head Circumference --      Peak Flow --      Pain Score 05/23/22 1945 0     Pain Loc --      Pain Edu? --      Excl. in [place]? --    No data found.  Updated Vital Signs BP 108/74 (BP Location: Left Arm)   Pulse 69   Temp 98 F (36.7 C) (Oral)   Resp 15   LMP 04/14/2022   SpO2 98%   Visual Acuity Right Eye Distance:   Left Eye Distance:  Bilateral Distance:    Right Eye Near:   Left Eye Near:    Bilateral Near:     Physical Exam Vitals and nursing note reviewed.  Constitutional:      Appearance: She is not ill-appearing or toxic-appearing.  HENT:     Head: Normocephalic and atraumatic.     Comments: No rash, signs of excoriation, erythema, warmth, or swelling to the area of concern.     Right Ear: Hearing, tympanic membrane, ear canal and external ear normal.     Left Ear: Hearing, tympanic membrane, ear canal and external ear normal.     Nose: Nose normal.     Mouth/Throat:     Lips: Pink.     Mouth: Mucous membranes are moist.     Pharynx: No posterior oropharyngeal erythema.  Eyes:     General: Lids are normal. Vision grossly intact. Gaze aligned appropriately.     Extraocular Movements: Extraocular movements intact.     Conjunctiva/sclera: Conjunctivae normal.     Right eye: Right conjunctiva is not injected. No chemosis, exudate or hemorrhage.    Left eye: Left conjunctiva is not injected. No chemosis,  exudate or hemorrhage.    Comments: EOMs intact without pain or dizziness elicited. No drainage noted.   Cardiovascular:     Rate and Rhythm: Normal rate and regular rhythm.     Heart sounds: Normal heart sounds, S1 normal and S2 normal.  Pulmonary:     Effort: Pulmonary effort is normal. No respiratory distress.     Breath sounds: Normal breath sounds and air entry.  Musculoskeletal:     Cervical back: Neck supple.  Skin:    General: Skin is warm and dry.     Capillary Refill: Capillary refill takes less than 2 seconds.     Findings: No rash.  Neurological:     General: No focal deficit present.     Mental Status: She is alert and oriented to person, place, and time. Mental status is at baseline.     Cranial Nerves: No dysarthria or facial asymmetry.  Psychiatric:        Mood and Affect: Mood normal.        Speech: Speech normal.        Behavior: Behavior normal.        Thought Content: Thought content normal.        Judgment: Judgment normal.      UC Treatments / Results  Labs (all labs ordered are listed, but only abnormal results are displayed) Labs Reviewed - No data to display  EKG   Radiology No results found.  Procedures Procedures (including critical care time)  Medications Ordered in UC Medications - No data to display  Initial Impression / Assessment and Plan / UC Course  I have reviewed the triage vital signs and the nursing notes.  Pertinent labs & imaging results that were available during my care of the patient were reviewed by me and considered in my medical decision making (see chart for details).   1. Pruritus Unclear etiology of patient's symptoms. No appreciable rash or facial abnormality to exam. Would like to trial cetirizine 10mg  once daily to reduce itching. May purchase olopatadine eye drops over the counter to use as needed for itching and watery drainage to the eyes. May follow-up with urgent care and/or PCP if she develops any new or  worsening symptoms.   Discussed physical exam and available lab work findings in clinic with patient.  Counseled patient regarding  appropriate use of medications and potential side effects for all medications recommended or prescribed today. Discussed red flag signs and symptoms of worsening condition,when to call the PCP office, return to urgent care, and when to seek higher level of care in the emergency department. Patient verbalizes understanding and agreement with plan. All questions answered. Patient discharged in stable condition.    Final Clinical Impressions(s) / UC Diagnoses   Final diagnoses:  Pruritus     Discharge Instructions      Purchase Zyrtec and take this over-the-counter 10 mg at bedtime daily for the next 1 to 2 weeks to see if this helps with itching.   You may also purchase Pataday eyedrops (olopatadine) and use these as directed to help with itching to the eyes.  If you develop any new or worsening symptoms or do not improve in the next 2 to 3 days, please return.  If your symptoms are severe, please go to the emergency room.  Follow-up with your primary care provider for further evaluation and management of your symptoms as well as ongoing wellness visits.  I hope you feel better!     ED Prescriptions   None    PDMP not reviewed this encounter.   Talbot Grumbling, FNP 05/25/22 0900

## 2022-06-15 ENCOUNTER — Other Ambulatory Visit: Payer: Self-pay

## 2022-06-15 ENCOUNTER — Inpatient Hospital Stay (HOSPITAL_BASED_OUTPATIENT_CLINIC_OR_DEPARTMENT_OTHER)
Admission: EM | Admit: 2022-06-15 | Discharge: 2022-06-19 | DRG: 103 | Disposition: A | Discharge status: Home / Self Care | Payer: BC Managed Care – PPO | Attending: Internal Medicine | Admitting: Internal Medicine

## 2022-06-15 ENCOUNTER — Encounter (HOSPITAL_BASED_OUTPATIENT_CLINIC_OR_DEPARTMENT_OTHER): Payer: Self-pay | Admitting: Radiology

## 2022-06-15 ENCOUNTER — Encounter (HOSPITAL_COMMUNITY): Payer: Self-pay

## 2022-06-15 ENCOUNTER — Ambulatory Visit (INDEPENDENT_AMBULATORY_CARE_PROVIDER_SITE_OTHER)
Admission: EM | Admit: 2022-06-15 | Discharge: 2022-06-15 | Disposition: A | Discharge status: Home / Self Care | Payer: BC Managed Care – PPO | Source: Home / Self Care

## 2022-06-15 ENCOUNTER — Emergency Department (HOSPITAL_BASED_OUTPATIENT_CLINIC_OR_DEPARTMENT_OTHER): Payer: BC Managed Care – PPO

## 2022-06-15 DIAGNOSIS — Z1152 Encounter for screening for COVID-19: Secondary | ICD-10-CM | POA: Insufficient documentation

## 2022-06-15 DIAGNOSIS — R110 Nausea: Secondary | ICD-10-CM | POA: Insufficient documentation

## 2022-06-15 DIAGNOSIS — I10 Essential (primary) hypertension: Secondary | ICD-10-CM | POA: Insufficient documentation

## 2022-06-15 DIAGNOSIS — R519 Headache, unspecified: Secondary | ICD-10-CM | POA: Insufficient documentation

## 2022-06-15 DIAGNOSIS — R6889 Other general symptoms and signs: Secondary | ICD-10-CM | POA: Diagnosis not present

## 2022-06-15 DIAGNOSIS — G43909 Migraine, unspecified, not intractable, without status migrainosus: Principal | ICD-10-CM | POA: Diagnosis present

## 2022-06-15 DIAGNOSIS — Z87891 Personal history of nicotine dependence: Secondary | ICD-10-CM

## 2022-06-15 DIAGNOSIS — M542 Cervicalgia: Secondary | ICD-10-CM | POA: Insufficient documentation

## 2022-06-15 DIAGNOSIS — G932 Benign intracranial hypertension: Secondary | ICD-10-CM

## 2022-06-15 DIAGNOSIS — R509 Fever, unspecified: Secondary | ICD-10-CM | POA: Diagnosis not present

## 2022-06-15 DIAGNOSIS — M419 Scoliosis, unspecified: Secondary | ICD-10-CM | POA: Insufficient documentation

## 2022-06-15 DIAGNOSIS — A879 Viral meningitis, unspecified: Secondary | ICD-10-CM | POA: Diagnosis present

## 2022-06-15 DIAGNOSIS — K047 Periapical abscess without sinus: Secondary | ICD-10-CM

## 2022-06-15 DIAGNOSIS — H9203 Otalgia, bilateral: Secondary | ICD-10-CM | POA: Insufficient documentation

## 2022-06-15 DIAGNOSIS — G039 Meningitis, unspecified: Principal | ICD-10-CM

## 2022-06-15 DIAGNOSIS — Z8249 Family history of ischemic heart disease and other diseases of the circulatory system: Secondary | ICD-10-CM

## 2022-06-15 LAB — COMPREHENSIVE METABOLIC PANEL
ALT: 17 U/L (ref 0–44)
AST: 29 U/L (ref 15–41)
Albumin: 4.2 g/dL (ref 3.5–5.0)
Alkaline Phosphatase: 52 U/L (ref 38–126)
Anion gap: 8 (ref 5–15)
BUN: 8 mg/dL (ref 6–20)
CO2: 22 mmol/L (ref 22–32)
Calcium: 8.7 mg/dL — ABNORMAL LOW (ref 8.9–10.3)
Chloride: 101 mmol/L (ref 98–111)
Creatinine, Ser: 0.91 mg/dL (ref 0.44–1.00)
GFR, Estimated: >60 mL/min (ref >60)
Glucose, Bld: 83 mg/dL (ref 70–99)
Potassium: 3.8 mmol/L (ref 3.5–5.1)
Sodium: 131 mmol/L — ABNORMAL LOW (ref 135–145)
Total Bilirubin: 0.8 mg/dL (ref 0.3–1.2)
Total Protein: 8.1 g/dL (ref 6.5–8.1)

## 2022-06-15 LAB — CBC WITH DIFFERENTIAL/PLATELET
Abs Immature Granulocytes: 0.01 10*3/uL (ref 0.00–0.07)
Abs Immature Granulocytes: 0.02 10*3/uL (ref 0.00–0.07)
Basophils Absolute: 0 10*3/uL (ref 0.0–0.1)
Basophils Absolute: 0 10*3/uL (ref 0.0–0.1)
Basophils Relative: 1 %
Basophils Relative: 1 %
Eosinophils Absolute: 0 10*3/uL (ref 0.0–0.5)
Eosinophils Absolute: 0 10*3/uL (ref 0.0–0.5)
Eosinophils Relative: 0 %
Eosinophils Relative: 0 %
HCT: 40.2 % (ref 36.0–46.0)
HCT: 39.3 % (ref 36.0–46.0)
Hemoglobin: 13.6 g/dL (ref 12.0–15.0)
Hemoglobin: 13.2 g/dL (ref 12.0–15.0)
Immature Granulocytes: 0 %
Immature Granulocytes: 0 %
Lymphocytes Relative: 14 %
Lymphocytes Relative: 18 %
Lymphs Abs: 0.9 10*3/uL (ref 0.7–4.0)
Lymphs Abs: 1.2 10*3/uL (ref 0.7–4.0)
MCH: 29.7 pg (ref 26.0–34.0)
MCH: 29.1 pg (ref 26.0–34.0)
MCHC: 33.8 g/dL (ref 30.0–36.0)
MCHC: 33.6 g/dL (ref 30.0–36.0)
MCV: 87.8 fL (ref 80.0–100.0)
MCV: 86.6 fL (ref 80.0–100.0)
Monocytes Absolute: 0.3 10*3/uL (ref 0.1–1.0)
Monocytes Absolute: 0.4 10*3/uL (ref 0.1–1.0)
Monocytes Relative: 5 %
Monocytes Relative: 6 %
Neutro Abs: 4.9 10*3/uL (ref 1.7–7.7)
Neutro Abs: 4.9 10*3/uL (ref 1.7–7.7)
Neutrophils Relative %: 80 %
Neutrophils Relative %: 75 %
Platelets: 278 10*3/uL (ref 150–400)
Platelets: 281 10*3/uL (ref 150–400)
RBC: 4.58 MIL/uL (ref 3.87–5.11)
RBC: 4.54 MIL/uL (ref 3.87–5.11)
RDW: 11.3 % — ABNORMAL LOW (ref 11.5–15.5)
RDW: 11.2 % — ABNORMAL LOW (ref 11.5–15.5)
WBC: 6.1 10*3/uL (ref 4.0–10.5)
WBC: 6.5 10*3/uL (ref 4.0–10.5)
nRBC: 0 % (ref 0.0–0.2)
nRBC: 0 % (ref 0.0–0.2)

## 2022-06-15 LAB — POC INFLUENZA A AND B ANTIGEN (URGENT CARE ONLY)
INFLUENZA A ANTIGEN, POC: NEGATIVE
INFLUENZA B ANTIGEN, POC: NEGATIVE

## 2022-06-15 LAB — RESP PANEL BY RT-PCR (RSV, FLU A&B, COVID)  RVPGX2
Influenza A by PCR: NEGATIVE
Influenza B by PCR: NEGATIVE
Resp Syncytial Virus by PCR: NEGATIVE
SARS Coronavirus 2 by RT PCR: NEGATIVE

## 2022-06-15 LAB — POCT RAPID STREP A, ED / UC: Streptococcus, Group A Screen (Direct): NEGATIVE

## 2022-06-15 MED ORDER — ONDANSETRON 4 MG PO TBDP
ORAL_TABLET | ORAL | Status: AC
  Filled 2022-06-15: qty 1

## 2022-06-15 MED ORDER — KETOROLAC TROMETHAMINE 15 MG/ML IJ SOLN
15.0000 mg | Freq: Once | INTRAMUSCULAR | Status: AC
  Administered 2022-06-15: 15 mg via INTRAVENOUS
  Filled 2022-06-15 (×2): qty 1

## 2022-06-15 MED ORDER — SODIUM CHLORIDE 0.9 % IV SOLN
1.0000 g | Freq: Once | INTRAVENOUS | Status: AC
  Administered 2022-06-16: 1 g via INTRAVENOUS
  Filled 2022-06-15: qty 10

## 2022-06-15 MED ORDER — DIPHENHYDRAMINE HCL 50 MG/ML IJ SOLN
25.0000 mg | Freq: Once | INTRAMUSCULAR | Status: AC
  Administered 2022-06-16: 25 mg via INTRAVENOUS
  Filled 2022-06-15: qty 1

## 2022-06-15 MED ORDER — ACETAMINOPHEN 325 MG PO TABS
ORAL_TABLET | ORAL | Status: AC
  Filled 2022-06-15: qty 3

## 2022-06-15 MED ORDER — KETOROLAC TROMETHAMINE 30 MG/ML IJ SOLN: 30.0000 mg | Freq: Once | INTRAMUSCULAR | Status: DC

## 2022-06-15 MED ORDER — KETOROLAC TROMETHAMINE 30 MG/ML IJ SOLN
30.0000 mg | Freq: Once | INTRAMUSCULAR | Status: AC
  Administered 2022-06-15: 30 mg via INTRAMUSCULAR

## 2022-06-15 MED ORDER — ONDANSETRON 4 MG PO TBDP
4.0000 mg | ORAL_TABLET | Freq: Once | ORAL | Status: AC
  Administered 2022-06-15: 4 mg via ORAL

## 2022-06-15 MED ORDER — ACETAMINOPHEN 325 MG PO TABS
975.0000 mg | ORAL_TABLET | Freq: Once | ORAL | Status: AC
  Administered 2022-06-15: 975 mg via ORAL

## 2022-06-15 MED ORDER — KETOROLAC TROMETHAMINE 30 MG/ML IJ SOLN
INTRAMUSCULAR | Status: AC
  Filled 2022-06-15: qty 1

## 2022-06-15 MED ORDER — LACTATED RINGERS IV BOLUS
1000.0000 mL | Freq: Once | INTRAVENOUS | Status: AC
  Administered 2022-06-16: 1000 mL via INTRAVENOUS

## 2022-06-15 MED ORDER — ACETAMINOPHEN 500 MG PO TABS
1000.0000 mg | ORAL_TABLET | Freq: Once | ORAL | Status: AC
  Administered 2022-06-15: 1000 mg via ORAL
  Filled 2022-06-15: qty 2

## 2022-06-15 MED ORDER — VANCOMYCIN HCL IN DEXTROSE 1-5 GM/200ML-% IV SOLN
1000.0000 mg | Freq: Once | INTRAVENOUS | Status: AC
  Administered 2022-06-16: 1000 mg via INTRAVENOUS
  Filled 2022-06-15: qty 200

## 2022-06-15 MED ORDER — METOCLOPRAMIDE HCL 5 MG/ML IJ SOLN
10.0000 mg | Freq: Once | INTRAMUSCULAR | Status: AC
  Administered 2022-06-16: 10 mg via INTRAVENOUS
  Filled 2022-06-15: qty 2

## 2022-06-15 MED ORDER — ONDANSETRON HCL 4 MG/2ML IJ SOLN
4.0000 mg | Freq: Once | INTRAMUSCULAR | Status: AC
  Administered 2022-06-16: 4 mg via INTRAVENOUS
  Filled 2022-06-15: qty 2

## 2022-06-15 NOTE — ED Triage Notes (Signed)
Pt states she was sent her from urgent care for fever (tylenol was given), nausea, vomiting, back pain that started last. Mom says urgent care was concerned for meningitis.

## 2022-06-15 NOTE — ED Notes (Signed)
Patient is being discharged from the Urgent Care and sent to the Emergency Department via personal vehicle. Per Audery Amel PA, patient is in need of higher level of care due to weakness and pain. Patient is aware and verbalizes understanding of plan of care.  Vitals:   06/15/22 1642  BP: 101/70  Pulse: (!) 114  Resp: 18  Temp: (!) 101.7 F (38.7 C)  SpO2: 98%

## 2022-06-15 NOTE — ED Notes (Signed)
Pt reports a fever and HA since last night, was seen @ UC and rec coming here to r/o Meningitis

## 2022-06-15 NOTE — ED Provider Notes (Signed)
[facility] Provider Note   CSN: [id number] Arrival date & time: 06/15/22  [id number]     History Chief Complaint  Patient presents with   Headache    HPI Marilyn Ramirez is a 27 y.o. female presenting for chief complaint of severe headache and fevers.  Patient states that starting last night at approximately 9 PM she had sudden onset severe headache as well as fevers with temperature max of 103.  She is ambulatory tolerating p.o. intake.  However tonight she has been having worsening intolerance p.o. intake.  They presented to a urgent care for reassessment.  There is concern for potential meningitis.  Patient is vaccinated per mother.  No known sick contacts recently.  She does work in a [employer]. No history of migraines, sudden onset headache tonight..   Patient's recorded medical, surgical, social, medication list and allergies were reviewed in the Snapshot window as part of the initial history.   Review of Systems   Review of Systems  Constitutional:  Positive for fever. Negative for chills.  HENT:  Negative for ear pain and sore throat.   Eyes:  Negative for pain and visual disturbance.  Respiratory:  Negative for cough and shortness of breath.   Cardiovascular:  Negative for chest pain and palpitations.  Gastrointestinal:  Negative for abdominal pain and vomiting.  Genitourinary:  Negative for dysuria and hematuria.  Musculoskeletal:  Negative for arthralgias and back pain.  Skin:  Negative for color change and rash.  Neurological:  Positive for weakness and headaches. Negative for seizures and syncope.  All other systems reviewed and are negative.   Physical Exam Updated Vital Signs BP 118/77   Pulse 73   Temp 99.2 F (37.3 C)   Resp 18   Ht 5' 4"$  (1.626 m)   Wt 57.6 kg   LMP 06/07/2022   SpO2 100%   BMI 21.80 kg/m  Physical Exam Vitals and nursing note reviewed.  Constitutional:      General: She is  not in acute distress.    Appearance: She is well-developed.  HENT:     Head: Normocephalic and atraumatic.  Eyes:     Conjunctiva/sclera: Conjunctivae normal.  Neck:     Comments: Positive Brudzinski sign Cardiovascular:     Rate and Rhythm: Normal rate and regular rhythm.     Heart sounds: No murmur heard. Pulmonary:     Effort: Pulmonary effort is normal. No respiratory distress.     Breath sounds: Normal breath sounds.  Abdominal:     General: There is no distension.     Palpations: Abdomen is soft.     Tenderness: There is no abdominal tenderness. There is no right CVA tenderness or left CVA tenderness.  Musculoskeletal:        General: No swelling or tenderness. Normal range of motion.     Cervical back: Rigidity present.  Skin:    General: Skin is warm and dry.  Neurological:     General: No focal deficit present.     Mental Status: She is alert and oriented to person, place, and time. Mental status is at baseline.     Cranial Nerves: No cranial nerve deficit.      ED Course/ Medical Decision Making/ A&P    Procedures Procedures   Medications Ordered in ED Medications  ketorolac (TORADOL) 15 MG/ML injection 15 mg (15 mg Intravenous Not Given 06/15/22 2325)  cefTRIAXone (ROCEPHIN) 1 g in sodium chloride 0.9 %  100 mL IVPB (has no administration in time range)  lactated ringers bolus 1,000 mL (has no administration in time range)  ondansetron (ZOFRAN) injection 4 mg (has no administration in time range)  metoCLOPramide (REGLAN) injection 10 mg (has no administration in time range)  diphenhydrAMINE (BENADRYL) injection 25 mg (has no administration in time range)  vancomycin (VANCOCIN) IVPB 1000 mg/200 mL premix (has no administration in time range)  acetaminophen (TYLENOL) tablet 1,000 mg (1,000 mg Oral Given 06/15/22 2348)    Medical Decision Making:    Marilyn Ramirez is a 27 y.o. female who presented to the ED today with headache and fever detailed above.      Complete initial physical exam performed, notably the patient  was hemodynamically stable in no acute distress.  Has a positive Brudzinski sign and pain with motion of the neck..      Reviewed and confirmed nursing documentation for past medical history, family history, social history.    Initial Assessment:   Patient's history of present on his physicals and findings raise concern for developing meningitis.  She has a recent dental infection which is possible etiology or seeding.  She had a temperature of 103 earlier today does not have a history of headaches and it did not respond to NSAIDs. Patient consented to lumbar puncture which was performed in the emergency department.  Ultimately no fluid was available for collection based on procedure today.  Patient requested termination of procedure due to discomfort. Given pretest probability with concern for meningitis, ongoing symptoms despite NSAID administration, shared medical decision making with patient and family.  They would like to proceed with treatment for meningitis at this time.  Patient will be admitted to main campus, discussed with hospitalist who agreed with admission. Will consult with neurology for further recommendations. Disposition:   Based on the above findings, I believe this patient is stable for admission.    Patient/family educated about specific findings on our evaluation and explained exact reasons for admission.  Patient/family educated about clinical situation and time was allowed to answer questions.   Admission team communicated with and agreed with need for admission. Patient admitted. Patient ready to move at this time.     Emergency Department Medication Summary:   Medications  ketorolac (TORADOL) 15 MG/ML injection 15 mg (15 mg Intravenous Not Given 06/15/22 2325)  cefTRIAXone (ROCEPHIN) 1 g in sodium chloride 0.9 % 100 mL IVPB (has no administration in time range)  lactated ringers bolus 1,000 mL (has no  administration in time range)  ondansetron (ZOFRAN) injection 4 mg (has no administration in time range)  metoCLOPramide (REGLAN) injection 10 mg (has no administration in time range)  diphenhydrAMINE (BENADRYL) injection 25 mg (has no administration in time range)  vancomycin (VANCOCIN) IVPB 1000 mg/200 mL premix (has no administration in time range)  acetaminophen (TYLENOL) tablet 1,000 mg (1,000 mg Oral Given 06/15/22 2348)        Clinical Impression:  1. Meningitis      Admit   Final Clinical Impression(s) / ED Diagnoses Final diagnoses:  Meningitis    Rx / DC Orders ED Discharge Orders     None         Tretha Sciara, MD 06/16/22 0003

## 2022-06-15 NOTE — Discharge Instructions (Addendum)
Marilyn Ramirez was admitted to the Hospital on 06/15/2022 and Discharged on Discharge Date 06/19/2022 and should be excused from work/school   for 4  days starting 06/15/2022 , may return to work/school without any restrictions.  Call Bess Harvest MD, [facility] [phone] with questions.  Charlynne Cousins M.D on 06/19/2022,at 11:30 AM  [facility] Office  [phone]

## 2022-06-15 NOTE — ED Provider Notes (Addendum)
[place]    CSN: [id number] Arrival date & time: 06/15/22  1527      History   Chief Complaint Chief Complaint  Patient presents with   Cough    HPI Marilyn Ramirez is a 27 y.o. female who presents with onset of ST, body aches, HA behind her eye balls, bilateral ear pain, neck pain and nausea  since last night. Has the worst headache ever. Per her mother who got on the phone with me, pt has been dealing with a dental infection for weeks, and is currently on Augmentin. Pt feels she is dizzy, like the room is spinning when she is laying down still.     Past Medical History:  Diagnosis Date   Osteoarthritis of knee    Ovarian cyst    Scoliosis     Patient Active Problem List   Diagnosis Date Noted   Low back pain 03/14/2011   HYPERTENSION 09/17/2007   SKIN RASH 09/17/2007   PROTEINURIA 09/17/2007   DERMATITIS, ATOPIC 09/20/2006    History reviewed. No pertinent surgical history.  OB History   No obstetric history on file.      Home Medications    Prior to Admission medications   Medication Sig Start Date End Date Taking? Authorizing Provider  amoxicillin-clavulanate (AUGMENTIN) 875-125 MG tablet Take 1 tablet by mouth 2 (two) times daily. 06/02/22  Yes [provider]    Family History History reviewed. No pertinent family history.  Social History Social History   Tobacco Use   Smoking status: Former   Smokeless tobacco: Never  Substance Use Topics   Alcohol use: No   Drug use: No     Allergies   Patient has no known allergies.   Review of Systems Review of Systems  Constitutional:  Positive for activity change, appetite change, chills, fatigue and fever.  HENT:  Positive for congestion, dental problem, ear pain, postnasal drip and sore throat. Negative for ear discharge, facial swelling and trouble swallowing.   Eyes:  Negative for discharge.  Respiratory:  Positive for cough. Negative for chest tightness, shortness of  breath and wheezing.   Cardiovascular:  Negative for chest pain.  Gastrointestinal:  Positive for nausea. Negative for abdominal pain and vomiting.  Genitourinary:  Negative for dysuria.  Musculoskeletal:  Positive for myalgias, neck pain and neck stiffness.  Skin:  Negative for rash and wound.  Neurological:  Positive for dizziness and headaches.  Hematological:  Negative for adenopathy.     Physical Exam Triage Vital Signs ED Triage Vitals  Enc Vitals Group     BP 06/15/22 1642 101/70     Pulse Rate 06/15/22 1642 (!) 114     Resp 06/15/22 1642 18     Temp 06/15/22 1642 (!) 101.7 F (38.7 C)     Temp Source 06/15/22 1642 Oral     SpO2 06/15/22 1642 98 %     Weight --      Height --      Head Circumference --      Peak Flow --      Pain Score 06/15/22 1643 10     Pain Loc --      Pain Edu? --      Excl. in [place]? --    No data found.  Updated Vital Signs BP 101/70 (BP Location: Left Arm)   Pulse (!) 114   Temp (!) 101.7 F (38.7 C) (Oral)   Resp 18   LMP 06/07/2022  SpO2 98%  Repeated vitals at discharge: BP 99.8 BP P- 98  BP 108/64 Visual Acuity Right Eye Distance:   Left Eye Distance:   Bilateral Distance:    Right Eye Near:   Left Eye Near:    Bilateral Near:     Physical Exam Vitals and nursing note reviewed.  Constitutional:      General: She is in acute distress.     Appearance: She is ill-appearing.     Comments: Crying and moaning in pain, holding her head  HENT:     Right Ear: Tympanic membrane, ear canal and external ear normal.     Left Ear: Tympanic membrane, ear canal and external ear normal.     Nose: Nose normal.     Mouth/Throat:     Mouth: Mucous membranes are moist.     Comments: Has mild erythema on pharynx R upper gum area is mildly red, no signs of abscess Eyes:     General: No scleral icterus.    Conjunctiva/sclera: Conjunctivae normal.     Pupils: Pupils are equal, round, and reactive to light.  Neck:     Meningeal:  Brudzinski's sign and Kernig's sign absent.     Comments: Neck movement provoked worse HA Cardiovascular:     Rate and Rhythm: Regular rhythm. Tachycardia present.  Pulmonary:     Effort: Pulmonary effort is normal.     Breath sounds: Normal breath sounds.  Abdominal:     General: Bowel sounds are normal.     Palpations: Abdomen is soft. There is no mass.     Tenderness: There is no abdominal tenderness. There is no guarding or rebound.  Musculoskeletal:        General: Normal range of motion.     Cervical back: Rigidity present.  Skin:    General: Skin is warm and dry.     Findings: No rash.  Neurological:     Mental Status: She is alert and oriented to person, place, and time.     Gait: Gait normal.  Psychiatric:        Mood and Affect: Mood normal.        Behavior: Behavior normal.        Thought Content: Thought content normal.        Judgment: Judgment normal.      UC Treatments / Results  Labs (all labs ordered are listed, but only abnormal results are displayed) Labs Reviewed  SARS CORONAVIRUS 2 (TAT 6-24 HRS)  CBC WITH DIFFERENTIAL/PLATELET  POC INFLUENZA A AND B ANTIGEN (URGENT CARE ONLY)  POCT RAPID STREP A, ED / UC  POCT URINALYSIS DIPSTICK, ED / UC  Flu A&B and rapid strep are negative.   EKG   Radiology No results found.  Procedures Procedures (including critical care time)  Medications Ordered in UC Medications  ondansetron (ZOFRAN-ODT) disintegrating tablet 4 mg (4 mg Oral Given 06/15/22 1724)  acetaminophen (TYLENOL) tablet 975 mg (975 mg Oral Given 06/15/22 1724)  ketorolac (TORADOL) 30 MG/ML injection 30 mg (30 mg Intramuscular Given 06/15/22 1810)    Initial Impression / Assessment and Plan / UC Course  I have reviewed the triage vital signs and the nursing notes.  Severe HA Positive Meningeal signs  Flu like illness Dental infection under treatment.   I spoke with her mother who was on the phone and she was very concerned she may be  septic since she has been dealing with a dental infection for a few week. I explained  to her that pt has meningitis type symptoms, and "worse HA ever", so she needs further work up in the ER. So she came to pick her up.  CBC with diff has been ordered.  She was given Toradol 30 mg IM while awaiting for mother to pick her up.      Final Clinical Impressions(s) / UC Diagnoses   Final diagnoses:  Flu-like symptoms  Fever, unspecified  Worsening headaches  Dental infection     Discharge Instructions      Due to your severe headache and neck pain though your fever is down, you need to go to the ER today for further work up     ED Prescriptions   None    PDMP not reviewed this encounter.   Shelby Mattocks, PA-C 06/15/22 1815    Rodriguez-Southworth, Ragan, PA-C 06/15/22 [phone]

## 2022-06-15 NOTE — Discharge Instructions (Signed)
Due to your severe headache and neck pain though your fever is down, you need to go to the ER today for further work up

## 2022-06-15 NOTE — ED Triage Notes (Addendum)
Pt is here for headache, bilateral ear pain ,boay aches, dizziness, sore throat . Neck pain, back pain eyes hurting .Pt also stated she is currently on antibiotic for a tooth infection. x2days

## 2022-06-16 ENCOUNTER — Observation Stay (HOSPITAL_COMMUNITY): Payer: BC Managed Care – PPO

## 2022-06-16 ENCOUNTER — Encounter (HOSPITAL_COMMUNITY): Payer: Self-pay | Admitting: Internal Medicine

## 2022-06-16 DIAGNOSIS — Z87891 Personal history of nicotine dependence: Secondary | ICD-10-CM | POA: Diagnosis not present

## 2022-06-16 DIAGNOSIS — R519 Headache, unspecified: Secondary | ICD-10-CM | POA: Diagnosis present

## 2022-06-16 DIAGNOSIS — G039 Meningitis, unspecified: Secondary | ICD-10-CM

## 2022-06-16 DIAGNOSIS — K047 Periapical abscess without sinus: Secondary | ICD-10-CM | POA: Insufficient documentation

## 2022-06-16 DIAGNOSIS — Z8249 Family history of ischemic heart disease and other diseases of the circulatory system: Secondary | ICD-10-CM | POA: Diagnosis not present

## 2022-06-16 DIAGNOSIS — Z1152 Encounter for screening for COVID-19: Secondary | ICD-10-CM | POA: Diagnosis not present

## 2022-06-16 DIAGNOSIS — G43909 Migraine, unspecified, not intractable, without status migrainosus: Secondary | ICD-10-CM | POA: Diagnosis not present

## 2022-06-16 LAB — HCG, SERUM, QUALITATIVE: Preg, Serum: NEGATIVE

## 2022-06-16 LAB — CBC
HCT: 37.2 % (ref 36.0–46.0)
Hemoglobin: 12.7 g/dL (ref 12.0–15.0)
MCH: 29.9 pg (ref 26.0–34.0)
MCHC: 34.1 g/dL (ref 30.0–36.0)
MCV: 87.5 fL (ref 80.0–100.0)
Platelets: 229 10*3/uL (ref 150–400)
RBC: 4.25 MIL/uL (ref 3.87–5.11)
RDW: 11.3 % — ABNORMAL LOW (ref 11.5–15.5)
WBC: 3.8 10*3/uL — ABNORMAL LOW (ref 4.0–10.5)
nRBC: 0 % (ref 0.0–0.2)

## 2022-06-16 LAB — CREATININE, SERUM
Creatinine, Ser: 0.93 mg/dL (ref 0.44–1.00)
GFR, Estimated: >60 mL/min (ref >60)

## 2022-06-16 LAB — HIV ANTIBODY (ROUTINE TESTING W REFLEX): HIV Screen 4th Generation wRfx: NONREACTIVE

## 2022-06-16 LAB — SARS CORONAVIRUS 2 (TAT 6-24 HRS): SARS Coronavirus 2: NEGATIVE

## 2022-06-16 MED ORDER — ONDANSETRON HCL 4 MG/2ML IJ SOLN: 4.0000 mg | Freq: Four times a day (QID) | INTRAMUSCULAR | Status: DC | PRN

## 2022-06-16 MED ORDER — VANCOMYCIN HCL IN DEXTROSE 1-5 GM/200ML-% IV SOLN
1000.0000 mg | Freq: Two times a day (BID) | INTRAVENOUS | Status: DC
  Administered 2022-06-16: 1000 mg via INTRAVENOUS
  Filled 2022-06-16 (×2): qty 200

## 2022-06-16 MED ORDER — LACTATED RINGERS IV SOLN: INTRAVENOUS | Status: DC

## 2022-06-16 MED ORDER — ENOXAPARIN SODIUM 40 MG/0.4ML IJ SOSY
40.0000 mg | PREFILLED_SYRINGE | INTRAMUSCULAR | Status: DC
  Administered 2022-06-16 – 2022-06-17 (×2): 40 mg via SUBCUTANEOUS
  Filled 2022-06-16 (×3): qty 0.4

## 2022-06-16 MED ORDER — DEXTROSE 5 % IV SOLN
600.0000 mg | Freq: Three times a day (TID) | INTRAVENOUS | Status: DC
  Administered 2022-06-16 – 2022-06-17 (×4): 600 mg via INTRAVENOUS
  Filled 2022-06-16 (×7): qty 12

## 2022-06-16 MED ORDER — ONDANSETRON HCL 4 MG PO TABS
4.0000 mg | ORAL_TABLET | Freq: Four times a day (QID) | ORAL | Status: DC | PRN
  Administered 2022-06-18 – 2022-06-19 (×2): 4 mg via ORAL
  Filled 2022-06-16 (×2): qty 1

## 2022-06-16 MED ORDER — ACYCLOVIR SODIUM 50 MG/ML IV SOLN
INTRAVENOUS | Status: AC
  Filled 2022-06-16: qty 10

## 2022-06-16 MED ORDER — IBUPROFEN 200 MG PO TABS
400.0000 mg | ORAL_TABLET | Freq: Four times a day (QID) | ORAL | Status: DC | PRN
  Administered 2022-06-16 – 2022-06-17 (×2): 400 mg via ORAL
  Filled 2022-06-16 (×2): qty 2

## 2022-06-16 MED ORDER — VANCOMYCIN HCL 750 MG/150ML IV SOLN
750.0000 mg | Freq: Two times a day (BID) | INTRAVENOUS | Status: DC
  Administered 2022-06-16 – 2022-06-17 (×2): 750 mg via INTRAVENOUS
  Filled 2022-06-16 (×3): qty 150

## 2022-06-16 MED ORDER — ACYCLOVIR 200 MG PO CAPS
200.0000 mg | ORAL_CAPSULE | Freq: Once | ORAL | Status: AC
  Administered 2022-06-16: 200 mg via ORAL
  Filled 2022-06-16: qty 1

## 2022-06-16 MED ORDER — POLYETHYLENE GLYCOL 3350 17 G PO PACK
17.0000 g | PACK | Freq: Every day | ORAL | Status: AC
  Administered 2022-06-16 – 2022-06-17 (×2): 17 g via ORAL
  Filled 2022-06-16 (×2): qty 1

## 2022-06-16 MED ORDER — IOHEXOL 350 MG/ML SOLN
75.0000 mL | Freq: Once | INTRAVENOUS | Status: AC | PRN
  Administered 2022-06-16: 75 mL via INTRAVENOUS

## 2022-06-16 MED ORDER — SODIUM CHLORIDE 0.9 % IV SOLN
2.0000 g | Freq: Two times a day (BID) | INTRAVENOUS | Status: DC
  Administered 2022-06-16 – 2022-06-17 (×3): 2 g via INTRAVENOUS
  Filled 2022-06-16 (×3): qty 20

## 2022-06-16 MED ORDER — DEXTROSE 5 % IV SOLN
600.0000 mg | Freq: Once | INTRAVENOUS | Status: AC
  Administered 2022-06-16: 600 mg via INTRAVENOUS
  Filled 2022-06-16: qty 12

## 2022-06-16 NOTE — Consult Note (Signed)
NEURO HOSPITALIST CONSULT NOTE   Requesting physician: Dr. Aileen Fass  Reason for Consult: Meningitis  History obtained from:  Patient and Chart     HPI:                                                                                                                                          Marilyn Ramirez is an 27 y.o. female with a PMHx of approximately 1.5 years of right mandibular wisdom tooth pain, ovarian cyst and scoliosis who was recently diagnosed with a dental infection and prescribed ABX by her dentist, with surgery planned for next week. She presented to OSH with a headache that started 2 days PTA. The headache was so bad it woke her up from sleep at night. She had a fever of 103 at home.   CT head at OSH showed no intracranial abnormality. LP was attempted but was unsuccessful. She was started on empiric vancomycin, ceftriaxone and acyclovir for presumed meningitis.   At bedside this evening, she is conversant but complains of bifrontal and bioccipital nonthrobbing 7/10 headache. She states that she feels somewhat improved since yesterday, but still with significant neck pain and it hurts to move her neck as well as her legs, which causes spasms of back pain as well.   Past Medical History:  Diagnosis Date   Osteoarthritis of knee    Ovarian cyst    Scoliosis     History reviewed. No pertinent surgical history.  Family History  Problem Relation Age of Onset   Hypertension Mother              Social History:  reports that she has quit smoking. She has never used smokeless tobacco. She reports that she does not drink alcohol and does not use drugs.  No Known Allergies  MEDICATIONS:                                                                                                                     Prior to Admission:  No medications prior to admission.   Scheduled:  acyclovir       enoxaparin (LOVENOX) injection  40 mg Subcutaneous Q24H    polyethylene glycol  17 g Oral Daily   Continuous:  acyclovir Stopped (06/16/22 1547)  cefTRIAXone (ROCEPHIN)  IV Stopped (06/16/22 0730)   lactated ringers 75 mL/hr at 06/16/22 2045   vancomycin       ROS:                                                                                                                                       Does not complain of any weakness, difficulty speaking, numbness or vision changes. Other ROS as per HPI.    Blood pressure 95/61, pulse 83, temperature 98.2 F (36.8 C), temperature source Oral, resp. rate 16, height 5' 4"$  (1.626 m), weight 57.6 kg, last menstrual period 06/07/2022, SpO2 100 %.   General Examination:                                                                                                       Physical Exam  HEENT-  /AT. Slight neck stiffness in flexion and rotation, which also elicits head pain. Positive Kernig's sign.  Lungs- Respirations unlabored Extremities- No edema   Neurological Examination Mental Status: Alert, oriented x 5, thought content appropriate.  Speech fluent without evidence of aphasia.  Able to follow all commands without difficulty. Pained affect.  Cranial Nerves: II: Temporal visual fields intact with no extinction to DSS. PERRL  III,IV, VI: No ptosis. EOMI.  V: Temp sensation equal bilaterally  VII: Smile symmetric VIII: Hearing intact to voice IX,X: No hoarseness XI: Symmetric shoulder shrug XII: Midline tongue extension Motor: BUE 5/5 proximally and distally BLE 5/5 proximally and distally  Experiences neck pain with arm and leg movement.  Sensory: Light touch intact throughout, bilaterally.  Deep Tendon Reflexes: 2+ and symmetric throughout Cerebellar: No ataxia with FNF bilaterally  Gait: Deferred   Lab Results: Basic Metabolic Panel: Recent Labs  Lab 06/15/22 1908 06/16/22 1752  NA 131*  --   K 3.8  --   CL 101  --   CO2 22  --   GLUCOSE 83  --   BUN 8  --   CREATININE  0.91 0.93  CALCIUM 8.7*  --     CBC: Recent Labs  Lab 06/15/22 1827 06/15/22 1908 06/16/22 1752  WBC 6.1 6.5 3.8*  NEUTROABS 4.9 4.9  --   HGB 13.6 13.2 12.7  HCT 40.2 39.3 37.2  MCV 87.8 86.6 87.5  PLT 278 281 229    Cardiac Enzymes: No results for input(s): "CKTOTAL", "CKMB", "CKMBINDEX", "TROPONINI" in the last 168 hours.  Lipid Panel: No results for input(s): "CHOL", "TRIG", "  HDL", "CHOLHDL", "VLDL", "LDLCALC" in the last 168 hours.  Imaging: CT MAXILLOFACIAL W CONTRAST  Result Date: 06/16/2022 CLINICAL DATA:  Tooth pain EXAM: CT MAXILLOFACIAL WITH CONTRAST TECHNIQUE: Multidetector CT imaging of the maxillofacial structures was performed with intravenous contrast. Multiplanar CT image reconstructions were also generated. RADIATION DOSE REDUCTION: This exam was performed according to the departmental dose-optimization program which includes automated exposure control, adjustment of the mA and/or kV according to patient size and/or use of iterative reconstruction technique. CONTRAST:  59m OMNIPAQUE IOHEXOL 350 MG/ML SOLN COMPARISON:  CT Head 06/15/22 FINDINGS: Osseous: No fracture or mandibular dislocation. No destructive process. Presence of streak artifact from dental amalgam slightly limits the ability to assess for presence dental caries. No large periapical lucency. Orbits: Negative. No traumatic or inflammatory finding. Sinuses: No mastoid or middle ear effusion.1 paranasal sinuses are clear. Soft tissues: Negative. Limited intracranial: No significant or unexpected finding. IMPRESSION: Presence of streak artifact from dental amalgam slightly limits the ability to assess for presence dental caries. This limitation, no large periapical lucency or odontogenic soft tissue abscess visualized. Electronically Signed   By: HMarin RobertsM.D.   On: 06/16/2022 20:12   CT HEAD WO CONTRAST (5MM)  Result Date: 06/15/2022 CLINICAL DATA:  Headache and fever. EXAM: CT HEAD WITHOUT CONTRAST  TECHNIQUE: Contiguous axial images were obtained from the base of the skull through the vertex without intravenous contrast. RADIATION DOSE REDUCTION: This exam was performed according to the departmental dose-optimization program which includes automated exposure control, adjustment of the mA and/or kV according to patient size and/or use of iterative reconstruction technique. COMPARISON:  Head CT dated 12/11/2018. FINDINGS: Brain: The ventricles and sulci are appropriate size for the patient's age. The gray-white matter discrimination is preserved. There is no acute intracranial hemorrhage. No mass effect or midline shift. No extra-axial fluid collection. Vascular: No hyperdense vessel or unexpected calcification. Skull: Normal. Negative for fracture or focal lesion. Sinuses/Orbits: Mild mucoperiosteal thickening of paranasal sinuses. No air-fluid level. Mastoid air cells are clear. Other: None IMPRESSION: Unremarkable noncontrast CT of the brain. Electronically Signed   By: AAnner CreteM.D.   On: 06/15/2022 22:40     Assessment: 27year old female with right mandibular molar tooth infection who presented to OSH with a headache that started 2 days PTA. The headache was so bad it woke her up from sleep at night. She had a fever of 103 at home.  - Exam reveals mild nuchal rigidity and positive Kernig's sign. No focal neurological deficits noted. Mentation is normal.  - Overall clinical picture most consistent with meningitis - LP attempt at OSH unsuccessful. Will need fluoro-guided LP to confirm and guide therapy.  - HIV testing negative  Recommendations: - ID consult - Fluoro-guided LP for protein, glucose, cell count with differential, gram stain, bacterial culture, fungal culture, cryptococcal antigen, VDRL, comprehensive CSF infectious disease PCR panel.  - Continue empiric meningitis-dose ABX: IV vancomycin, ceftriaxone and acyclovir - MRI brain with and without contrast if pregnancy test is  negative. If pregnancy test is positive, then obtain MRI without contrast    Electronically signed: Dr. EKerney Elbe2/22/2024, 9:21 PM

## 2022-06-16 NOTE — H&P (Signed)
History and Physical  Marilyn Ramirez [id number] DOB: [birth year] DOA: 06/15/2022  PCP: Patient, No Pcp Per Patient coming from: Home  I have personally briefly reviewed patient's old medical records in [place]   Chief Complaint: headache  HPI: Marilyn Biak Voeller is a 27 y.o. female past medical history of tooth infection comes into the ED with a headache that started 2 days prior to admission she relate it woke her up at night she had a fever of 103 at home she is ambulatory but not able to take orals. She says she has a right lower molar tooth pain which she was prescribed augmented by the dentist and she will need to have surgery next week.  She relates she only took about 2 to 3 days of Augmentin  In the ED: CT of the head was unremarkable, lumbar puncture was attempted but it was unsuccessful she was started empirically on IV Vanco Rocephin and acyclovir.  She had a temp of 101 no leukocytosis    Review of Systems: All systems reviewed and apart from history of presenting illness, are negative.  Past Medical History:  Diagnosis Date   Osteoarthritis of knee    Ovarian cyst    Scoliosis    History reviewed. No pertinent surgical history. Social History:  reports that she has quit smoking. She has never used smokeless tobacco. She reports that she does not drink alcohol and does not use drugs.   No Known Allergies  Family History  Problem Relation Age of Onset   Hypertension Mother       Prior to Admission medications   Medication Sig Start Date End Date Taking? Authorizing Provider  amoxicillin-clavulanate (AUGMENTIN) 875-125 MG tablet Take 1 tablet by mouth 2 (two) times daily. 06/02/22   [provider]   Physical Exam: Vitals:   06/16/22 1230 06/16/22 1242 06/16/22 1300 06/16/22 1530  BP: 104/76  107/69 107/88  Pulse: (!) 59  65 73  Resp: 14  13 16  $ Temp:  98 F (36.7 C)    TempSrc:  Oral    SpO2: 99%  100% 98%  Weight:      Height:         General exam: Moderately built and nourished patient, lying comfortably supine, she has a tender lower right wisdom teeth tooth Head, eyes and ENT: Nontraumatic and normocephalic.  No nuchal rigidity Neck: Supple. No JVD, carotid bruit or thyromegaly. Lymphatics: No lymphadenopathy. Respiratory system: Clear to auscultation. No increased work of breathing. Cardiovascular system: S1 and S2 heard, RRR. No JVD, murmurs, gallops, clicks or pedal edema. Gastrointestinal system: Abdomen is nondistended, soft and nontender. Normal bowel sounds heard. No organomegaly or masses appreciated. Central nervous system: Alert and oriented. No focal neurological deficits. Extremities: Symmetric 5 x 5 power. Peripheral pulses symmetrically felt.  Skin: No rashes or acute findings. Musculoskeletal system: Negative exam. Psychiatry: Pleasant and cooperative.   Labs on Admission:  Basic Metabolic Panel: Recent Labs  Lab 06/15/22 1908  NA 131*  K 3.8  CL 101  CO2 22  GLUCOSE 83  BUN 8  CREATININE 0.91  CALCIUM 8.7*   Liver Function Tests: Recent Labs  Lab 06/15/22 1908  AST 29  ALT 17  ALKPHOS 52  BILITOT 0.8  PROT 8.1  ALBUMIN 4.2   No results for input(s): "LIPASE", "AMYLASE" in the last 168 hours. No results for input(s): "AMMONIA" in the last 168 hours. CBC: Recent Labs  Lab 06/15/22 1827 06/15/22 1908  WBC 6.1 6.5  NEUTROABS 4.9 4.9  HGB 13.6 13.2  HCT 40.2 39.3  MCV 87.8 86.6  PLT 278 281   Cardiac Enzymes: No results for input(s): "CKTOTAL", "CKMB", "CKMBINDEX", "TROPONINI" in the last 168 hours.  BNP (last 3 results) No results for input(s): "PROBNP" in the last 8760 hours. CBG: No results for input(s): "GLUCAP" in the last 168 hours.  Radiological Exams on Admission: CT HEAD WO CONTRAST (5MM)  Result Date: 06/15/2022 CLINICAL DATA:  Headache and fever. EXAM: CT HEAD WITHOUT CONTRAST TECHNIQUE: Contiguous axial images were obtained from the base of the skull  through the vertex without intravenous contrast. RADIATION DOSE REDUCTION: This exam was performed according to the departmental dose-optimization program which includes automated exposure control, adjustment of the mA and/or kV according to patient size and/or use of iterative reconstruction technique. COMPARISON:  Head CT dated 12/11/2018. FINDINGS: Brain: The ventricles and sulci are appropriate size for the patient's age. The gray-white matter discrimination is preserved. There is no acute intracranial hemorrhage. No mass effect or midline shift. No extra-axial fluid collection. Vascular: No hyperdense vessel or unexpected calcification. Skull: Normal. Negative for fracture or focal lesion. Sinuses/Orbits: Mild mucoperiosteal thickening of paranasal sinuses. No air-fluid level. Mastoid air cells are clear. Other: None IMPRESSION: Unremarkable noncontrast CT of the brain. Electronically Signed   By: Anner Crete M.D.   On: 06/15/2022 22:40    EKG: Independently reviewed.  None  Assessment/Plan Headache/  Tooth infection I think is all related to her tooth infection, will consult IR to do an LP.  Sent for culture Gram stain protein and glucose. Will do CT maxillofacial she may have an abscess in that tooth on the right wisdom tooth. Will continue IV Vanco Rocephin and acyclovir blood cultures have been sent. Check a pregnancy test. Zofran for nausea, use tramadol and ibuprofen for pain.     DVT Prophylaxis: lovenox Code Status: Full  Family Communication: mother  Disposition Plan: observation      Charlynne Cousins MD [facility]   06/16/2022, 5:07 PM

## 2022-06-16 NOTE — Progress Notes (Signed)
Plan of Care Note for accepted transfer   Patient: Marilyn Ramirez MRN: [id number]   DOA: 06/15/2022  Facility requesting transfer: [facility] ED Requesting Provider: Dr. Oswald Hillock, EDP  Reason for transfer: Suspected meningitis Facility course: The patient is a 27 year old with no significant past medical history who works at a [employer] facility, presented with complaints of sudden onset severe headache for the past 1 day.  Associated with a subjective fever at home with Tmax 103.  No prior history of migraine or headaches.    In the ED, physical exam notable for nuchal rigidity, and positive Brudzinski's sign.  Febrile in the ED with Tmax of 101.7, tachycardic with heart rate of 114.  Noncontrast CT head was nonacute.  Due to concern for possible meningitis, the patient was started on empiric IV antibiotics Rocephin, IV vancomycin, and antiviral, acyclovir.  Neurohospitalist consulted.  The patient was admitted at [facility] progressive care unit as inpatient status.  Plan of care: The patient is accepted for admission to Progressive unit, at [facility].  Inpatient status.  Author: Kayleen Memos, DO 06/16/2022  Check [email or web address] for on-call coverage.  Nursing staff, Please call [place] number on Amion as soon as patient's arrival, so appropriate admitting provider can evaluate the pt.

## 2022-06-16 NOTE — ED Notes (Signed)
Patient in bed resting with eyes close. Equal rise and fall of chest noted. No acute distress noted. Will continue to monitor.

## 2022-06-16 NOTE — Progress Notes (Signed)
Pharmacy Antibiotic Note  Marilyn Ramirez is a 27 y.o. female admitted on 06/15/2022 with fever/headache, possible meningitis.  Pharmacy has been consulted for Vancomycin and Acyclovir dosing.  Plan: Vancomycin 1000 mg IV q12h Acyclovir 600 mg IV q8h   Height: 5' 4"$  (162.6 cm) Weight: 57.6 kg (127 lb) IBW/kg (Calculated) : 54.7  Temp (24hrs), Avg:99.9 F (37.7 C), Min:98.7 F (37.1 C), Max:101.7 F (38.7 C)  Recent Labs  Lab 06/15/22 1827 06/15/22 1908  WBC 6.1 6.5  CREATININE  --  0.91    Estimated Creatinine Clearance: 81.6 mL/min (by C-G formula based on SCr of 0.91 mg/dL).    No Known Allergies   Marilyn Ramirez 06/16/2022 4:53 AM

## 2022-06-16 NOTE — Progress Notes (Addendum)
Pharmacy Antibiotic Note  Marilyn Ramirez is a 27 y.o. female admitted on 06/15/2022 with headache pending meningitis rule out and tooth infection with possible abscess.  Pharmacy has been consulted for acyclovir and vancomycin dosing. Patient is on ceftriaxone.   Goal trough 15-20 for meningitis. ClCr 81 ml/min. On LR @75$  ml/hr. LP pending. Tmax 103 at home. Afebrile here.   Plan: Acyclovir 673m q12 hr + LR to prevent nephrotoxicity Vancomycin 7563mq12 hr Continue ceftriaxone per team Monitor cultures, clinical status, renal function, vancomycin level Narrow abx as able and f/u duration    Height: 5' 4"$  (162.6 cm) Weight: 57.6 kg (127 lb) IBW/kg (Calculated) : 54.7  Temp (24hrs), Avg:98.5 F (36.9 C), Min:97.6 F (36.4 C), Max:99.2 F (37.3 C)  Recent Labs  Lab 06/15/22 1827 06/15/22 1908  WBC 6.1 6.5  CREATININE  --  0.91    Estimated Creatinine Clearance: 81.6 mL/min (by C-G formula based on SCr of 0.91 mg/dL).    No Known Allergies  Antimicrobials this admission: Vanc 2/22  >>  Acylovir 2/22 >>  CTX 2/22 >>   Dose adjustments this admission: 2/22 Vanc 1g q12 > 75020m12 hr  Microbiology results: 2/22 BCx:   2/22 CSF:   Thank you for allowing pharmacy to be a part of this patient's care.  LydBenetta SparharmD, BCPS, BCCP Clinical Pharmacist  Please check AMION for all [facility] numbers After 10:00 PM, call [phone]

## 2022-06-16 NOTE — Progress Notes (Signed)
Received pt on unit. Pt is oriented to unit bed alarm on and bed is locked in lowest position.

## 2022-06-17 ENCOUNTER — Observation Stay (HOSPITAL_COMMUNITY): Payer: BC Managed Care – PPO

## 2022-06-17 DIAGNOSIS — K047 Periapical abscess without sinus: Secondary | ICD-10-CM | POA: Diagnosis present

## 2022-06-17 DIAGNOSIS — A879 Viral meningitis, unspecified: Secondary | ICD-10-CM

## 2022-06-17 DIAGNOSIS — Z8249 Family history of ischemic heart disease and other diseases of the circulatory system: Secondary | ICD-10-CM | POA: Diagnosis not present

## 2022-06-17 DIAGNOSIS — G039 Meningitis, unspecified: Secondary | ICD-10-CM | POA: Diagnosis not present

## 2022-06-17 DIAGNOSIS — R519 Headache, unspecified: Secondary | ICD-10-CM | POA: Diagnosis present

## 2022-06-17 DIAGNOSIS — D72829 Elevated white blood cell count, unspecified: Secondary | ICD-10-CM | POA: Diagnosis not present

## 2022-06-17 DIAGNOSIS — G43909 Migraine, unspecified, not intractable, without status migrainosus: Secondary | ICD-10-CM | POA: Diagnosis present

## 2022-06-17 DIAGNOSIS — G43011 Migraine without aura, intractable, with status migrainosus: Secondary | ICD-10-CM | POA: Diagnosis not present

## 2022-06-17 DIAGNOSIS — Z87891 Personal history of nicotine dependence: Secondary | ICD-10-CM | POA: Diagnosis not present

## 2022-06-17 DIAGNOSIS — G932 Benign intracranial hypertension: Secondary | ICD-10-CM | POA: Diagnosis not present

## 2022-06-17 DIAGNOSIS — Z1152 Encounter for screening for COVID-19: Secondary | ICD-10-CM | POA: Diagnosis not present

## 2022-06-17 LAB — BASIC METABOLIC PANEL
Anion gap: 9 (ref 5–15)
BUN: 8 mg/dL (ref 6–20)
CO2: 24 mmol/L (ref 22–32)
Calcium: 8.4 mg/dL — ABNORMAL LOW (ref 8.9–10.3)
Chloride: 104 mmol/L (ref 98–111)
Creatinine, Ser: 0.81 mg/dL (ref 0.44–1.00)
GFR, Estimated: >60 mL/min (ref >60)
Glucose, Bld: 86 mg/dL (ref 70–99)
Potassium: 3.4 mmol/L — ABNORMAL LOW (ref 3.5–5.1)
Sodium: 137 mmol/L (ref 135–145)

## 2022-06-17 LAB — MENINGITIS/ENCEPHALITIS PANEL (CSF)

## 2022-06-17 LAB — CSF CELL COUNT WITH DIFFERENTIAL
RBC Count, CSF: 6 /mm3 — ABNORMAL HIGH
Tube #: 1
WBC, CSF: 1 /mm3 (ref 0–5)

## 2022-06-17 LAB — CBC
HCT: 35.4 % — ABNORMAL LOW (ref 36.0–46.0)
Hemoglobin: 12 g/dL (ref 12.0–15.0)
MCH: 29.6 pg (ref 26.0–34.0)
MCHC: 33.9 g/dL (ref 30.0–36.0)
MCV: 87.2 fL (ref 80.0–100.0)
Platelets: 233 10*3/uL (ref 150–400)
RBC: 4.06 MIL/uL (ref 3.87–5.11)
RDW: 11.3 % — ABNORMAL LOW (ref 11.5–15.5)
WBC: 3.3 10*3/uL — ABNORMAL LOW (ref 4.0–10.5)
nRBC: 0 % (ref 0.0–0.2)

## 2022-06-17 LAB — GLUCOSE, CSF: Glucose, CSF: 55 mg/dL (ref 40–70)

## 2022-06-17 LAB — PROTEIN, CSF: Total  Protein, CSF: 23 mg/dL (ref 15–45)

## 2022-06-17 MED ORDER — LIDOCAINE HCL (PF) 1 % IJ SOLN
5.0000 mL | Freq: Once | INTRAMUSCULAR | Status: AC
  Administered 2022-06-17: 3 mL via INTRADERMAL

## 2022-06-17 MED ORDER — POTASSIUM CHLORIDE CRYS ER 20 MEQ PO TBCR
40.0000 meq | EXTENDED_RELEASE_TABLET | Freq: Two times a day (BID) | ORAL | Status: AC
  Administered 2022-06-17 (×2): 40 meq via ORAL
  Filled 2022-06-17 (×2): qty 2

## 2022-06-17 MED ORDER — ACETAMINOPHEN 325 MG PO TABS: 650.0000 mg | ORAL_TABLET | ORAL | Status: DC | PRN

## 2022-06-17 MED ORDER — ACETAZOLAMIDE 250 MG PO TABS
500.0000 mg | ORAL_TABLET | Freq: Two times a day (BID) | ORAL | Status: DC
  Administered 2022-06-17 – 2022-06-18 (×2): 500 mg via ORAL
  Filled 2022-06-17 (×3): qty 2

## 2022-06-17 MED ORDER — PROCHLORPERAZINE EDISYLATE 10 MG/2ML IJ SOLN
5.0000 mg | Freq: Four times a day (QID) | INTRAMUSCULAR | Status: DC
  Administered 2022-06-17 – 2022-06-18 (×2): 5 mg via INTRAVENOUS
  Filled 2022-06-17 (×3): qty 2

## 2022-06-17 MED ORDER — DIPHENHYDRAMINE HCL 50 MG/ML IJ SOLN
12.5000 mg | Freq: Four times a day (QID) | INTRAMUSCULAR | Status: DC
  Administered 2022-06-17 – 2022-06-18 (×2): 12.5 mg via INTRAVENOUS
  Filled 2022-06-17 (×3): qty 1

## 2022-06-17 MED ORDER — CYCLOBENZAPRINE HCL 10 MG PO TABS
5.0000 mg | ORAL_TABLET | Freq: Once | ORAL | Status: AC
  Administered 2022-06-17: 5 mg via ORAL
  Filled 2022-06-17: qty 1

## 2022-06-17 MED ORDER — KETOROLAC TROMETHAMINE 15 MG/ML IJ SOLN
15.0000 mg | Freq: Four times a day (QID) | INTRAMUSCULAR | Status: DC
  Administered 2022-06-17 – 2022-06-19 (×5): 15 mg via INTRAVENOUS
  Filled 2022-06-17 (×6): qty 1

## 2022-06-17 NOTE — Progress Notes (Signed)
Neurology Progress Note  Patient ID: Marilyn Ramirez is a 27 y.o. with PMHx of  has a past medical history of Osteoarthritis of knee, Ovarian cyst, and Scoliosis.  Subjective: The bifrontal portion of her headache has improved from 10 to 6 after lumbar puncture and in this setting she is able to provide me substantial more history.  She has also not had any further fevers since her initial presentation.  She reports that the headache on Tuesday did wake her out of sleep but she is actually been having gradually worsening headache over a few weeks.  Denies any new medications, any vitamin A exposure, steroids or steroid tapering, antibiotics other than amoxicillin.  She notes she has been having transient obscurations of her vision (described as "glare" in her eye lasting 1 to 2 minutes not typically involving the whole eye but affecting her driving to the point that she was planning an ophthalmologic evaluation).   She notes that she has additionally been having a focal tenderness in the left occipital area, which has not changed much after lumbar puncture.  She has had some mild nausea with her headache but no vomiting, endorses some throat pain and chills   Exam: Vitals:   06/17/22 1249 06/17/22 1605  BP: 99/60 118/68  Pulse: 77 (!) 102  Resp: 16 16  Temp: 98.8 F (37.1 C) 98.6 F (37 C)  SpO2: 100% 100%   Gen: In bed, comfortable  HEENT: Tenderness to palpation of the left occipital nerve Resp: non-labored breathing, no grossly audible wheezing Cardiac: Perfusing extremities well  Abd: soft, nt   Neuro: MS: Awake, alert, fluent speech, follows simple commands, oriented to person/place/time/situation CN: Funduscopic exam notable for at most Frisen grade 1 (possibly Frisen grade 0 papilledema, difficult to confidently state secondary to poor tolerance of exam due to photophobia), increased blurry vision on left lateral gaze with poor lateral movement of the left eye, face symmetric,  tongue midline, sensation bilaterally symmetric Motor: Using bilateral arms equally and symmetrically freely antigravity Sensory: Intact to light touch throughout   Pertinent Labs: Meningitis/encephalitis panel negative   Latest Reference Range & Units 06/17/22 10:51  Appearance, CSF CLEAR  CLEAR !  Glucose, CSF 40 - 70 mg/dL 55  RBC Count, CSF 0 /cu mm 6 (H)  WBC, CSF 0 - 5 /cu mm 1  Other Cells, CSF  TOO FEW TO COUNT, SMEAR AVAILABLE FOR REVIEW  Color, CSF COLORLESS  COLORLESS  Supernatant  NOT INDICATED  Total  Protein, CSF 15 - 45 mg/dL 23  Tube #  1  !: Data is abnormal (H): Data is abnormally high   Basic Metabolic Panel: Recent Labs  Lab 06/15/22 1908 06/16/22 1752 06/17/22 0810  NA 131*  --  137  K 3.8  --  3.4*  CL 101  --  104  CO2 22  --  24  GLUCOSE 83  --  86  BUN 8  --  8  CREATININE 0.91 0.93 0.81  CALCIUM 8.7*  --  8.4*    CBC: Recent Labs  Lab 06/15/22 1827 06/15/22 1908 06/16/22 1752 06/17/22 0810  WBC 6.1 6.5 3.8* 3.3*  NEUTROABS 4.9 4.9  --   --   HGB 13.6 13.2 12.7 12.0  HCT 40.2 39.3 37.2 35.4*  MCV 87.8 86.6 87.5 87.2  PLT 278 281 229 233    Coagulation Studies: No results for input(s): "LABPROT", "INR" in the last 72 hours.    Impression: Up 4% of IIH  patients have a normal BMI; while it is rare, certainly her CSF opening pressure supports this diagnosis and patient confidently confirms that that she was very relaxed at the time of the procedure (that it was not falsely elevated due to her being tense due to pain).  Furthermore examination findings of possible low-grade papilledema as well as poor left eye lateral movement.  Therefore I do think it is reasonable to start Diamox at this time  Additionally will treat her with migraine cocktails for the migrainous features of her headache  I agree with infectious disease team that meningitis is very unlikely with the CSF results and appreciate antibiotics having been  discontinued  Recommendations: -Trial of ice packs to the irritated left occipital nerve -Migraine cocktails with ketorolac 15 mg every 6 hours, Benadryl 12.5 mg every 6 hours and Compazine 5 mg every 6 hours, scheduled for now -Diamox 500 mg every 12 hours -Ice packs to the back of the left side of the head for symptomatic relief of occipital neuralgia -Appreciate management of comorbidities per primary team, neurology will follow along  [place] [phone]

## 2022-06-17 NOTE — Progress Notes (Signed)
[facility] PROGRESS NOTE    Progress Note  Marilyn Ramirez  [id number] DOB: [birth year] DOA: 06/15/2022 PCP: Patient, No Pcp Per     Brief Narrative:   Marilyn Ramirez is an 27 y.o. female past medical history of tooth infection comes in with headache that started 2 days prior to admission fever 103 not able to take oral medications at home. CT of the head was unremarkable lumbar puncture was attempted in the ED was unsuccessful.  Was started on IV Vanco Rocephin and acyclovir, neurology was consulted who recommended infectious disease consult, fluoroscopy guided LP .    Assessment/Plan:   Headache/ Tooth infection Concern about viral meningitis started on Vanco Rocephin and acyclovir. Negative, neurology was consulted recommended an MRI of the brain with and without contrast. IR consulted for fluoroscopy guided LP sent for cell count, Gram stain culture blood , glucose VDRL fungal cryptococcal. Consult ID    DVT prophylaxis: Lovenox Family Communication: Mother Status is: Observation The patient remains OBS appropriate and will d/c before 2 midnights.    Code Status:     Code Status Orders  (From admission, onward)           Start     Ordered   06/16/22 1710  Full code  Continuous       Question:  By:  Answer:  Consent: discussion documented in EHR   06/16/22 1712           Code Status History     This patient has a current code status but no historical code status.         IV Access:   Peripheral IV   Procedures and diagnostic studies:   CT MAXILLOFACIAL W CONTRAST  Result Date: 06/16/2022 CLINICAL DATA:  Tooth pain EXAM: CT MAXILLOFACIAL WITH CONTRAST TECHNIQUE: Multidetector CT imaging of the maxillofacial structures was performed with intravenous contrast. Multiplanar CT image reconstructions were also generated. RADIATION DOSE REDUCTION: This exam was performed according to the departmental dose-optimization program which includes  automated exposure control, adjustment of the mA and/or kV according to patient size and/or use of iterative reconstruction technique. CONTRAST:  73m OMNIPAQUE IOHEXOL 350 MG/ML SOLN COMPARISON:  CT Head 06/15/22 FINDINGS: Osseous: No fracture or mandibular dislocation. No destructive process. Presence of streak artifact from dental amalgam slightly limits the ability to assess for presence dental caries. No large periapical lucency. Orbits: Negative. No traumatic or inflammatory finding. Sinuses: No mastoid or middle ear effusion.1 paranasal sinuses are clear. Soft tissues: Negative. Limited intracranial: No significant or unexpected finding. IMPRESSION: Presence of streak artifact from dental amalgam slightly limits the ability to assess for presence dental caries. This limitation, no large periapical lucency or odontogenic soft tissue abscess visualized. Electronically Signed   By: HMarin RobertsM.D.   On: 06/16/2022 20:12   CT HEAD WO CONTRAST (5MM)  Result Date: 06/15/2022 CLINICAL DATA:  Headache and fever. EXAM: CT HEAD WITHOUT CONTRAST TECHNIQUE: Contiguous axial images were obtained from the base of the skull through the vertex without intravenous contrast. RADIATION DOSE REDUCTION: This exam was performed according to the departmental dose-optimization program which includes automated exposure control, adjustment of the mA and/or kV according to patient size and/or use of iterative reconstruction technique. COMPARISON:  Head CT dated 12/11/2018. FINDINGS: Brain: The ventricles and sulci are appropriate size for the patient's age. The gray-white matter discrimination is preserved. There is no acute intracranial hemorrhage. No mass effect or midline shift. No extra-axial fluid collection. Vascular: No hyperdense  vessel or unexpected calcification. Skull: Normal. Negative for fracture or focal lesion. Sinuses/Orbits: Mild mucoperiosteal thickening of paranasal sinuses. No air-fluid level. Mastoid air  cells are clear. Other: None IMPRESSION: Unremarkable noncontrast CT of the brain. Electronically Signed   By: Anner Crete M.D.   On: 06/15/2022 22:40     Medical Consultants:   None.   Subjective:    Marilyn Ramirez she relates her headache is not improved continues to be nauseated.  Objective:    Vitals:   06/16/22 2020 06/16/22 2322 06/17/22 0319 06/17/22 0853  BP: 95/61 (!) 98/58 94/71 109/67  Pulse: 83 88 75 73  Resp: '16 16 14 16  '$ Temp: 98.2 F (36.8 C) 98.5 F (36.9 C) 98.1 F (36.7 C) 98.4 F (36.9 C)  TempSrc: Oral Oral Oral Oral  SpO2: 100% 99% 99% 99%  Weight:      Height:       SpO2: 99 %   Intake/Output Summary (Last 24 hours) at 06/17/2022 0926 Last data filed at 06/17/2022 [id number] Gross per 24 hour  Intake 904 ml  Output --  Net 904 ml   Filed Weights   06/15/22 1903  Weight: 57.6 kg    Exam: General exam: In no acute distress. Respiratory system: Good air movement and clear to auscultation. Cardiovascular system: S1 & S2 heard, RRR. No JVD. Gastrointestinal system: Abdomen is nondistended, soft and nontender.  Skin: No rashes, lesions or ulcers Psychiatry: Judgement and insight appear normal. Mood & affect appropriate.    Data Reviewed:    Labs: Basic Metabolic Panel: Recent Labs  Lab 06/15/22 1908 06/16/22 1752 06/17/22 0810  NA 131*  --  137  K 3.8  --  3.4*  CL 101  --  104  CO2 22  --  24  GLUCOSE 83  --  86  BUN 8  --  8  CREATININE 0.91 0.93 0.81  CALCIUM 8.7*  --  8.4*   GFR Estimated Creatinine Clearance: 91.7 mL/min (by C-G formula based on SCr of 0.81 mg/dL). Liver Function Tests: Recent Labs  Lab 06/15/22 1908  AST 29  ALT 17  ALKPHOS 52  BILITOT 0.8  PROT 8.1  ALBUMIN 4.2   No results for input(s): "LIPASE", "AMYLASE" in the last 168 hours. No results for input(s): "AMMONIA" in the last 168 hours. Coagulation profile No results for input(s): "INR", "PROTIME" in the last 168 hours. COVID-19 Labs  No  results for input(s): "DDIMER", "FERRITIN", "LDH", "CRP" in the last 72 hours.  Lab Results  Component Value Date   SARSCOV2NAA NEGATIVE 06/15/2022   [place] NEGATIVE 06/15/2022   [place] Not Detected 11/17/2020   [place] NEGATIVE 12/19/2019    CBC: Recent Labs  Lab 06/15/22 1827 06/15/22 1908 06/16/22 1752 06/17/22 0810  WBC 6.1 6.5 3.8* 3.3*  NEUTROABS 4.9 4.9  --   --   HGB 13.6 13.2 12.7 12.0  HCT 40.2 39.3 37.2 35.4*  MCV 87.8 86.6 87.5 87.2  PLT 278 281 229 233   Cardiac Enzymes: No results for input(s): "CKTOTAL", "CKMB", "CKMBINDEX", "TROPONINI" in the last 168 hours. BNP (last 3 results) No results for input(s): "PROBNP" in the last 8760 hours. CBG: No results for input(s): "GLUCAP" in the last 168 hours. D-Dimer: No results for input(s): "DDIMER" in the last 72 hours. Hgb A1c: No results for input(s): "HGBA1C" in the last 72 hours. Lipid Profile: No results for input(s): "CHOL", "HDL", "LDLCALC", "TRIG", "CHOLHDL", "LDLDIRECT" in the last 72 hours. Thyroid function studies:  No results for input(s): "TSH", "T4TOTAL", "T3FREE", "THYROIDAB" in the last 72 hours.  Invalid input(s): "FREET3" Anemia work up: No results for input(s): "VITAMINB12", "FOLATE", "FERRITIN", "TIBC", "IRON", "RETICCTPCT" in the last 72 hours. Sepsis Labs: Recent Labs  Lab 06/15/22 1827 06/15/22 1908 06/16/22 1752 06/17/22 0810  WBC 6.1 6.5 3.8* 3.3*   Microbiology Recent Results (from the past 240 hour(s))  SARS CORONAVIRUS 2 (TAT 6-24 HRS) Anterior Nasal Swab     Status: None   Collection Time: 06/15/22  4:58 PM   Specimen: Anterior Nasal Swab  Result Value Ref Range Status   SARS Coronavirus 2 NEGATIVE NEGATIVE Final    Comment: (NOTE) SARS-CoV-2 target nucleic acids are NOT DETECTED.  The SARS-CoV-2 RNA is generally detectable in upper and lower respiratory specimens during the acute phase of infection. Negative results do not preclude SARS-CoV-2 infection, do  not rule out co-infections with other pathogens, and should not be used as the sole basis for treatment or other patient management decisions. Negative results must be combined with clinical observations, patient history, and epidemiological information. The expected result is Negative.  Fact Sheet for Patients: SugarRoll.be  Fact Sheet for Healthcare Providers: https://www.woods-mathews.com/  This test is not yet approved or cleared by the [place] FDA and  has been authorized for detection and/or diagnosis of SARS-CoV-2 by FDA under an Emergency Use Authorization (EUA). This EUA will remain  in effect (meaning this test can be used) for the duration of the COVID-19 declaration under Se ction 564(b)(1) of the Act, 21 U.S.C. section 360bbb-3(b)(1), unless the authorization is terminated or revoked sooner.  Performed at [facility], [place] [address] [place], [place] [address]   Resp panel by RT-PCR (RSV, Flu A&B, Covid) Anterior Nasal Swab     Status: None   Collection Time: 06/15/22  7:08 PM   Specimen: Anterior Nasal Swab  Result Value Ref Range Status   SARS Coronavirus 2 by RT PCR NEGATIVE NEGATIVE Final    Comment: (NOTE) SARS-CoV-2 target nucleic acids are NOT DETECTED.  The SARS-CoV-2 RNA is generally detectable in upper respiratory specimens during the acute phase of infection. The lowest concentration of SARS-CoV-2 viral copies this assay can detect is 138 copies/mL. A negative result does not preclude SARS-Cov-2 infection and should not be used as the sole basis for treatment or other patient management decisions. A negative result may occur with  improper specimen collection/handling, submission of specimen other than nasopharyngeal swab, presence of viral mutation(s) within the areas targeted by this assay, and inadequate number of viral copies(<138 copies/mL). A negative result must be combined with clinical  observations, patient history, and epidemiological information. The expected result is Negative.  Fact Sheet for Patients:  EntrepreneurPulse.com.au  Fact Sheet for Healthcare Providers:  IncredibleEmployment.be  This test is no Marilyn yet approved or cleared by the [place] FDA and  has been authorized for detection and/or diagnosis of SARS-CoV-2 by FDA under an Emergency Use Authorization (EUA). This EUA will remain  in effect (meaning this test can be used) for the duration of the COVID-19 declaration under Section 564(b)(1) of the Act, 21 U.S.C.section 360bbb-3(b)(1), unless the authorization is terminated  or revoked sooner.       Influenza A by PCR NEGATIVE NEGATIVE Final   Influenza B by PCR NEGATIVE NEGATIVE Final    Comment: (NOTE) The Xpert Xpress SARS-CoV-2/FLU/RSV plus assay is intended as an aid in the diagnosis of influenza from Nasopharyngeal swab specimens and should not be used  as a sole basis for treatment. Nasal washings and aspirates are unacceptable for Xpert Xpress SARS-CoV-2/FLU/RSV testing.  Fact Sheet for Patients: EntrepreneurPulse.com.au  Fact Sheet for Healthcare Providers: IncredibleEmployment.be  This test is not yet approved or cleared by the [place] FDA and has been authorized for detection and/or diagnosis of SARS-CoV-2 by FDA under an Emergency Use Authorization (EUA). This EUA will remain in effect (meaning this test can be used) for the duration of the COVID-19 declaration under Section 564(b)(1) of the Act, 21 U.S.C. section 360bbb-3(b)(1), unless the authorization is terminated or revoked.     Resp Syncytial Virus by PCR NEGATIVE NEGATIVE Final    Comment: (NOTE) Fact Sheet for Patients: EntrepreneurPulse.com.au  Fact Sheet for Healthcare Providers: IncredibleEmployment.be  This test is not yet approved or cleared by  the [place] FDA and has been authorized for detection and/or diagnosis of SARS-CoV-2 by FDA under an Emergency Use Authorization (EUA). This EUA will remain in effect (meaning this test can be used) for the duration of the COVID-19 declaration under Section 564(b)(1) of the Act, 21 U.S.C. section 360bbb-3(b)(1), unless the authorization is terminated or revoked.  Performed at [facility], [address] [place], [place] [address]   Blood culture (routine x 2)     Status: None (Preliminary result)   Collection Time: 06/16/22 12:01 AM   Specimen: BLOOD  Result Value Ref Range Status   Specimen Description   Final    BLOOD LEFT ANTECUBITAL Performed at [facility], [address] [place], [place] [address]    Special Requests   Final    BOTTLES DRAWN AEROBIC AND ANAEROBIC Blood Culture adequate volume Performed at [facility], [address] [place], [place] [address]    Culture   Final    NO GROWTH < 24 HOURS Performed at [facility], [place] [address] [place], [place] [address]    Report Status PENDING  Incomplete  Blood culture (routine x 2)     Status: None (Preliminary result)   Collection Time: 06/16/22  6:04 PM   Specimen: BLOOD LEFT ARM  Result Value Ref Range Status   Specimen Description BLOOD LEFT ARM  Final   Special Requests   Final    BOTTLES DRAWN AEROBIC AND ANAEROBIC Blood Culture adequate volume   Culture   Final    NO GROWTH < 24 HOURS Performed at [facility], [place] [address] [place] [address], [place], [place] [address]    Report Status PENDING  Incomplete     Medications:    enoxaparin (LOVENOX) injection  40 mg Subcutaneous Q24H   polyethylene glycol  17 g Oral Daily   potassium chloride  40 mEq Oral BID   Continuous Infusions:  acyclovir 600 mg (06/17/22 0540)   cefTRIAXone (ROCEPHIN)  IV Stopped (06/16/22 2215)   lactated ringers 75 mL/hr at 06/16/22 2045   vancomycin Stopped (06/16/22 2251)      LOS:  1 day   Charlynne Cousins  [facility]  06/17/2022, 9:27 AM

## 2022-06-17 NOTE — Plan of Care (Signed)
Neurology in to see pt tonight. Pt on Droplet Isolation for ? Meningitis. Pt possibly to get a LP later today to check for Meningitis. Pt had a CT of the MaxoFacial structure to check on possible abscess Right Lower Wisdom tooth.

## 2022-06-17 NOTE — Procedures (Signed)
Technically successful fluoro guided LP at L4-L5 level with opening pressure of 35 cm H2O and closing pressure of 14 cm H2O 14 cc of clear CSF sent to lab for analysis.  No immediate post procedural complication.  Please see imaging section of Epic for full dictation.    Reatha Armour, PA-C 06/17/2022, 10:41 AM

## 2022-06-17 NOTE — Progress Notes (Signed)
  Transition of Care [facility]) Screening Note   Patient Details  Name: Marilyn Ramirez Date of Birth: 05/01/1995   Transition of Care [facility]) CM/SW Contact:    Pollie Friar, RN Phone Number: 06/17/2022, 2:00 PM   Pt is from home. Ruling out meningitis.  Transition of Care Department [facility]) has reviewed patient. We will continue to monitor patient advancement through interdisciplinary progression rounds. If new patient transition needs arise, please place a TOC consult.

## 2022-06-17 NOTE — Consult Note (Signed)
[facility]       Reason for Consult: headache    Referring Physician: Dr. Aileen Fass  Principal Problem:   Headache Active Problems:   Tooth infection   Viral meningitis    enoxaparin (LOVENOX) injection  40 mg Subcutaneous Q24H   potassium chloride  40 mEq Oral BID    Recommendations: Continue supportive care Stop antibiotics Evaluation of high opening pressure per neurology Recheck WBC in 1 week  Assessment: She has a headache and LP signficant for an opening pressure of 35 cm H2O with no signs of infection.  Unclear etiology.   Recent fever and viral symptoms with runny nose and aches.  Leukopenia noted and was recently wnl.  She should have it rechecked in a week or so.    HPI: [name] is a 27 y.o. female with no signfiicant past medical history came in to the ED with complaint of headache.  She recently had a tooth infection and treated. Headache started about 2 days prior to admission.  Associated with a fever to 103 she reports from UC, though documented as 101.7.  No fever since.  Seen by neurology and noted some nuchal rigidity. No visual defects, no hearing defects.  No rash, no diarrhea.  Some recent rhinorrhea.  Started on empiric antibiotics.  LP done with just one WBC.  Normal protein, normal glucose.   Review of Systems:  Genitourinary: negative for frequency and dysuria Musculoskeletal: negative for myalgias, arthralgias, and stiff joints All other systems reviewed and are negative    Past Medical History:  Diagnosis Date   Osteoarthritis of knee    Ovarian cyst    Scoliosis     Social History   Tobacco Use   Smoking status: Former   Smokeless tobacco: Never  Vaping Use   Vaping Use: Never used  Substance Use Topics   Alcohol use: No   Drug use: No    Family History  Problem Relation Age of Onset   Hypertension Mother     No Known Allergies  Physical Exam: Constitutional: in no apparent distress   Vitals:   06/17/22 0853 06/17/22 1249  BP: 109/67 99/60  Pulse: 73 77  Resp: 16 16  Temp: 98.4 F (36.9 C) 98.8 F (37.1 C)  SpO2: 99% 100%   EYES: anicteric ENMT:no thrush Respiratory: normal respiratory effort Musculoskeletal: no edema Neuro: non-focal  Lab Results  Component Value Date   WBC 3.3 (L) 06/17/2022   HGB 12.0 06/17/2022   HCT 35.4 (L) 06/17/2022   MCV 87.2 06/17/2022   PLT 233 06/17/2022    Lab Results  Component Value Date   CREATININE 0.81 06/17/2022   BUN 8 06/17/2022   NA 137 06/17/2022   K 3.4 (L) 06/17/2022   CL 104 06/17/2022   CO2 24 06/17/2022    Lab Results  Component Value Date   ALT 17 06/15/2022   AST 29 06/15/2022   ALKPHOS 52 06/15/2022     Microbiology: Recent Results (from the past 240 hour(s))  SARS CORONAVIRUS 2 (TAT 6-24 HRS) Anterior Nasal Swab     Status: None   Collection Time: 06/15/22  4:58 PM   Specimen: Anterior Nasal Swab  Result Value Ref Range Status   SARS Coronavirus 2 NEGATIVE NEGATIVE Final    Comment: (NOTE) SARS-CoV-2 target nucleic acids are NOT DETECTED.  The SARS-CoV-2 RNA is generally detectable in upper and lower respiratory specimens during the acute phase of infection. Negative  results do not preclude SARS-CoV-2 infection, do not rule out co-infections with other pathogens, and should not be used as the sole basis for treatment or other patient management decisions. Negative results must be combined with clinical observations, patient history, and epidemiological information. The expected result is Negative.  Fact Sheet for Patients: SugarRoll.be  Fact Sheet for Healthcare Providers: https://www.woods-mathews.com/  This test is not yet approved or cleared by the [place] FDA and  has been authorized for detection and/or diagnosis of SARS-CoV-2 by FDA under an Emergency Use Authorization (EUA). This EUA will remain  in effect (meaning this test  can be used) for the duration of the COVID-19 declaration under Se ction 564(b)(1) of the Act, 21 U.S.C. section 360bbb-3(b)(1), unless the authorization is terminated or revoked sooner.  Performed at [facility], [place] [address] [place],  [address]   Resp panel by RT-PCR (RSV, Flu A&B, Covid) Anterior Nasal Swab     Status: None   Collection Time: 06/15/22  7:08 PM   Specimen: Anterior Nasal Swab  Result Value Ref Range Status   SARS Coronavirus 2 by RT PCR NEGATIVE NEGATIVE Final    Comment: (NOTE) SARS-CoV-2 target nucleic acids are NOT DETECTED.  The SARS-CoV-2 RNA is generally detectable in upper respiratory specimens during the acute phase of infection. The lowest concentration of SARS-CoV-2 viral copies this assay can detect is 138 copies/mL. A negative result does not preclude SARS-Cov-2 infection and should not be used as the sole basis for treatment or other patient management decisions. A negative result may occur with  improper specimen collection/handling, submission of specimen other than nasopharyngeal swab, presence of viral mutation(s) within the areas targeted by this assay, and inadequate number of viral copies(<138 copies/mL). A negative result must be combined with clinical observations, patient history, and epidemiological information. The expected result is Negative.  Fact Sheet for Patients:  EntrepreneurPulse.com.au  Fact Sheet for Healthcare Providers:  IncredibleEmployment.be  This test is no t yet approved or cleared by the [place] FDA and  has been authorized for detection and/or diagnosis of SARS-CoV-2 by FDA under an Emergency Use Authorization (EUA). This EUA will remain  in effect (meaning this test can be used) for the duration of the COVID-19 declaration under Section 564(b)(1) of the Act, 21 U.S.C.section 360bbb-3(b)(1), unless the authorization is terminated  or revoked sooner.        Influenza A by PCR NEGATIVE NEGATIVE Final   Influenza B by PCR NEGATIVE NEGATIVE Final    Comment: (NOTE) The Xpert Xpress SARS-CoV-2/FLU/RSV plus assay is intended as an aid in the diagnosis of influenza from Nasopharyngeal swab specimens and should not be used as a sole basis for treatment. Nasal washings and aspirates are unacceptable for Xpert Xpress SARS-CoV-2/FLU/RSV testing.  Fact Sheet for Patients: EntrepreneurPulse.com.au  Fact Sheet for Healthcare Providers: IncredibleEmployment.be  This test is not yet approved or cleared by the [place] FDA and has been authorized for detection and/or diagnosis of SARS-CoV-2 by FDA under an Emergency Use Authorization (EUA). This EUA will remain in effect (meaning this test can be used) for the duration of the COVID-19 declaration under Section 564(b)(1) of the Act, 21 U.S.C. section 360bbb-3(b)(1), unless the authorization is terminated or revoked.     Resp Syncytial Virus by PCR NEGATIVE NEGATIVE Final    Comment: (NOTE) Fact Sheet for Patients: EntrepreneurPulse.com.au  Fact Sheet for Healthcare Providers: IncredibleEmployment.be  This test is not yet approved or cleared by the [place] FDA  and has been authorized for detection and/or diagnosis of SARS-CoV-2 by FDA under an Emergency Use Authorization (EUA). This EUA will remain in effect (meaning this test can be used) for the duration of the COVID-19 declaration under Section 564(b)(1) of the Act, 21 U.S.C. section 360bbb-3(b)(1), unless the authorization is terminated or revoked.  Performed at [facility], [address] [place], [place] [address]   Blood culture (routine x 2)     Status: None (Preliminary result)   Collection Time: 06/16/22 12:01 AM   Specimen: BLOOD  Result Value Ref Range Status   Specimen Description   Final    BLOOD LEFT ANTECUBITAL Performed at [facility], [address] [place], [place] [address]    Special Requests   Final    BOTTLES DRAWN AEROBIC AND ANAEROBIC Blood Culture adequate volume Performed at [facility], [address] [place], [place] [address]    Culture   Final    NO GROWTH < 24 HOURS Performed at [facility], [place] [address], [place], [place] [address]    Report Status PENDING  Incomplete  Blood culture (routine x 2)     Status: None (Preliminary result)   Collection Time: 06/16/22  6:04 PM   Specimen: BLOOD LEFT ARM  Result Value Ref Range Status   Specimen Description BLOOD LEFT ARM  Final   Special Requests   Final    BOTTLES DRAWN AEROBIC AND ANAEROBIC Blood Culture adequate volume   Culture   Final    NO GROWTH < 24 HOURS Performed at [facility], [place] [address], [place], [place] [address]    Report Status PENDING  Incomplete  CSF culture w Gram Stain     Status: None (Preliminary result)   Collection Time: 06/17/22 10:51 AM   Specimen: PATH Cytology CSF; Cerebrospinal Fluid  Result Value Ref Range Status   Specimen Description CSF  Final   Special Requests NONE  Final   Gram Stain   Final    NO WBC SEEN NO ORGANISMS SEEN CYTOSPIN SMEAR Performed at [facility], [place] [address], [place], [place] [address]    Culture PENDING  Incomplete   Report Status PENDING  Incomplete    Thayer Headings, [place] for Infect[facility] [employer] [email or web address] 06/17/2022, 3:39 PM

## 2022-06-17 NOTE — Progress Notes (Signed)
Received request for IR performed LP from Medicine.  Per Dr. Olevia Bowens, this was unsuccessful in ED x2.  No procedure documented.  Image-guided LP is a diagnostic procedure rather than IR.  Informed Dr. Olevia Bowens of this and asked him to adjust the order so Diagnostic can be aware of this request, appropriate labs can be attached to the request, and appropriate team can workup.  Electronically Signed: Pasty Spillers, PA-C 06/17/2022, 8:31 AM

## 2022-06-17 NOTE — Progress Notes (Signed)
Pt given Flexeril. Explained to pt what it was for and if Dr. Nevada Crane ordered something else for pain I'd bring it in. Explained to pt she may be going downstairs to XR dept today to get the LP done in the specialty lab.

## 2022-06-18 DIAGNOSIS — G43011 Migraine without aura, intractable, with status migrainosus: Secondary | ICD-10-CM

## 2022-06-18 DIAGNOSIS — G932 Benign intracranial hypertension: Secondary | ICD-10-CM | POA: Diagnosis not present

## 2022-06-18 LAB — BASIC METABOLIC PANEL
Anion gap: 10 (ref 5–15)
BUN: 6 mg/dL (ref 6–20)
CO2: 19 mmol/L — ABNORMAL LOW (ref 22–32)
Calcium: 9 mg/dL (ref 8.9–10.3)
Chloride: 106 mmol/L (ref 98–111)
Creatinine, Ser: 0.88 mg/dL (ref 0.44–1.00)
GFR, Estimated: >60 mL/min (ref >60)
Glucose, Bld: 97 mg/dL (ref 70–99)
Potassium: 3.8 mmol/L (ref 3.5–5.1)
Sodium: 135 mmol/L (ref 135–145)

## 2022-06-18 MED ORDER — POLYETHYLENE GLYCOL 3350 17 G PO PACK
17.0000 g | PACK | Freq: Two times a day (BID) | ORAL | Status: DC
  Administered 2022-06-18 – 2022-06-19 (×2): 17 g via ORAL
  Filled 2022-06-18: qty 1

## 2022-06-18 MED ORDER — SODIUM CHLORIDE 0.9 % IV BOLUS
1000.0000 mL | Freq: Once | INTRAVENOUS | Status: AC
  Administered 2022-06-18: 1000 mL via INTRAVENOUS

## 2022-06-18 MED ORDER — POLYETHYLENE GLYCOL 3350 17 G PO PACK: 17.0000 g | PACK | Freq: Two times a day (BID) | ORAL | Status: DC

## 2022-06-18 MED ORDER — IBUPROFEN 400 MG PO TABS: 400.0000 mg | ORAL_TABLET | Freq: Four times a day (QID) | ORAL | 0 refills | Status: DC | PRN

## 2022-06-18 MED ORDER — ACETAMINOPHEN 500 MG PO TABS
1000.0000 mg | ORAL_TABLET | Freq: Three times a day (TID) | ORAL | Status: DC
  Administered 2022-06-18 – 2022-06-19 (×3): 1000 mg via ORAL
  Filled 2022-06-18 (×3): qty 2

## 2022-06-18 MED ORDER — ACETAZOLAMIDE 250 MG PO TABS: 500.0000 mg | ORAL_TABLET | Freq: Two times a day (BID) | ORAL | 0 refills | Status: DC

## 2022-06-18 MED ORDER — POLYETHYLENE GLYCOL 3350 17 G PO PACK
17.0000 g | PACK | Freq: Two times a day (BID) | ORAL | Status: DC
  Filled 2022-06-18: qty 1

## 2022-06-18 MED ORDER — ACETAZOLAMIDE 250 MG PO TABS
750.0000 mg | ORAL_TABLET | Freq: Two times a day (BID) | ORAL | Status: DC
  Administered 2022-06-18 – 2022-06-19 (×2): 750 mg via ORAL
  Filled 2022-06-18 (×2): qty 3

## 2022-06-18 MED ORDER — ONDANSETRON 4 MG PO TBDP: 4.0000 mg | ORAL_TABLET | Freq: Three times a day (TID) | ORAL | 0 refills | Status: DC | PRN

## 2022-06-18 MED ORDER — ACETAZOLAMIDE 250 MG PO TABS: 250.0000 mg | ORAL_TABLET | Freq: Two times a day (BID) | ORAL | Status: DC

## 2022-06-18 MED ORDER — ACETAZOLAMIDE 250 MG PO TABS: 750.0000 mg | ORAL_TABLET | Freq: Two times a day (BID) | ORAL | Status: DC

## 2022-06-18 NOTE — Discharge Summary (Addendum)
Physician Discharge Summary  Marilyn Ramirez [id number] DOB: July 03, 1995 DOA: 06/15/2022  PCP: Patient, No Pcp Per  Admit date: 06/15/2022 Discharge date: 06/18/2022  Admitted From: Home Disposition:  Home  Recommendations for Outpatient Follow-up:  Follow up with PCP in 1-2 weeks Please obtain BMP/CBC in one week   Home Health:No Equipment/Devices:None  Discharge Condition:Stable CODE STATUS:Full Diet recommendation: Heart Healthy   Brief/Interim Summary:  27 y.o. female past medical history of tooth infection comes in with headache that started 2 days prior to admission fever 103 not able to take oral medications at home.   Discharge Diagnoses:  Principal Problem:   Headache Active Problems:   Tooth infection  Migrainous headaches: There was acute viral meningitis she was started empirical antibiotic Rocephin and Aciclovir, LP was performed which was negative.  Infectious disease was consulted she remained afebrile no leukocytosis labs were unremarkable vitals were discontinued.  Meningitis was ruled out. Neurology was consulted recommended treated with a migraine cocktail by the next day her headaches were better. Neurology recommended to continue Diamox as an outpatient. She will follow-up with neurology as an outpatient. There was a concern of low-grade peripheral edema neurology recommended start Diamox she continue as an outpatient.  Discharge Instructions  Discharge Instructions     Diet - low sodium heart healthy   Complete by: As directed    Increase activity slowly   Complete by: As directed       Allergies as of 06/18/2022   No Known Allergies      Medication List     TAKE these medications    acetaZOLAMIDE 250 MG tablet Commonly known as: DIAMOX Take 2 tablets (500 mg total) by mouth 2 (two) times daily.   ibuprofen 400 MG tablet Commonly known as: ADVIL Take 1 tablet (400 mg total) by mouth every 6 (six) hours as needed for mild pain (or  Fever >/= 101).   ondansetron 4 MG disintegrating tablet Commonly known as: ZOFRAN-ODT Take 1 tablet (4 mg total) by mouth every 8 (eight) hours as needed for nausea or vomiting.        Follow-up Information     Schedule an appointment as soon as possible for a visit  with [facility] with your PCP/Specialist as discussed.   Contact information: [email or web address] Call our physician referral line at [phone].               No Known Allergies  Consultations: Neurology Interventional radiology Infectious disease   Procedures/Studies: DG FL GUIDED LUMBAR PUNCTURE  Result Date: 06/17/2022 CLINICAL DATA:  Provided history: Headache. Additional history: Fever. Concern for meningitis. EXAM: DIAGNOSTIC LUMBAR PUNCTURE UNDER FLUOROSCOPIC GUIDANCE COMPARISON:  CT abdomen/pelvis 06/11/2021. FLUOROSCOPY: Fluoroscopy time: 28 seconds (1 mGy). PROCEDURE: Mady Gemma, PA-C obtained informed consent from the patient prior to the procedure. This process included a discussion of procedural risks. The patient was positioned prone on the fluoroscopy table. An appropriate skin entry site was determined under fluoroscopy and marked. The operator donned sterile gloves and a mask. A time-out was performed. The lower back was prepped and draped in the usual sterile fashion. 1% lidocaine was used for local anesthesia. Lumbar puncture was performed at the L4-L5 level using a 20 gauge spinal needle with return of clear CSF with an opening pressure of 35 cmH2O. 14 mL of CSF were collected for laboratory studies. There was a closing pressure of 14 cmH2O. The needle was removed and a sterile dressing was applied to the skin entry site. The patient tolerated  the procedure well. No immediate post-procedure complication was apparent. The procedure was performed by Mady Gemma, PA-C, supervised by Dr. Kellie Simmering. IMPRESSION: 1. Technically successful fluoroscopically-guided L4-L5 lumbar  puncture. 2. Opening pressure: 35 cmH2O 3. 14 mL of CSF obtained for laboratory studies. 4. Closing pressure: 14 cmH2O. 5. No immediate post-procedure complication. Electronically Signed   By: Kellie Simmering D.O.   On: 06/17/2022 12:01   CT MAXILLOFACIAL W CONTRAST  Result Date: 06/16/2022 CLINICAL DATA:  Tooth pain EXAM: CT MAXILLOFACIAL WITH CONTRAST TECHNIQUE: Multidetector CT imaging of the maxillofacial structures was performed with intravenous contrast. Multiplanar CT image reconstructions were also generated. RADIATION DOSE REDUCTION: This exam was performed according to the departmental dose-optimization program which includes automated exposure control, adjustment of the mA and/or kV according to patient size and/or use of iterative reconstruction technique. CONTRAST:  31m OMNIPAQUE IOHEXOL 350 MG/ML SOLN COMPARISON:  CT Head 06/15/22 FINDINGS: Osseous: No fracture or mandibular dislocation. No destructive process. Presence of streak artifact from dental amalgam slightly limits the ability to assess for presence dental caries. No large periapical lucency. Orbits: Negative. No traumatic or inflammatory finding. Sinuses: No mastoid or middle ear effusion.1 paranasal sinuses are clear. Soft tissues: Negative. Limited intracranial: No significant or unexpected finding. IMPRESSION: Presence of streak artifact from dental amalgam slightly limits the ability to assess for presence dental caries. This limitation, no large periapical lucency or odontogenic soft tissue abscess visualized. Electronically Signed   By: HMarin RobertsM.D.   On: 06/16/2022 20:12   CT HEAD WO CONTRAST (5MM)  Result Date: 06/15/2022 CLINICAL DATA:  Headache and fever. EXAM: CT HEAD WITHOUT CONTRAST TECHNIQUE: Contiguous axial images were obtained from the base of the skull through the vertex without intravenous contrast. RADIATION DOSE REDUCTION: This exam was performed according to the departmental dose-optimization program which  includes automated exposure control, adjustment of the mA and/or kV according to patient size and/or use of iterative reconstruction technique. COMPARISON:  Head CT dated 12/11/2018. FINDINGS: Brain: The ventricles and sulci are appropriate size for the patient's age. The gray-white matter discrimination is preserved. There is no acute intracranial hemorrhage. No mass effect or midline shift. No extra-axial fluid collection. Vascular: No hyperdense vessel or unexpected calcification. Skull: Normal. Negative for fracture or focal lesion. Sinuses/Orbits: Mild mucoperiosteal thickening of paranasal sinuses. No air-fluid level. Mastoid air cells are clear. Other: None IMPRESSION: Unremarkable noncontrast CT of the brain. Electronically Signed   By: AAnner CreteM.D.   On: 06/15/2022 22:40   (Echo, Carotid, EGD, Colonoscopy, ERCP)    Subjective:   Discharge Exam: Vitals:   06/18/22 0420 06/18/22 0807  BP: 96/65 101/62  Pulse: 96 84  Resp: 19 18  Temp: 98.7 F (37.1 C) 98.3 F (36.8 C)  SpO2: 97% 100%   Vitals:   06/17/22 2002 06/17/22 2339 06/18/22 0420 06/18/22 0807  BP: 1'19/74 95/67 96/65 '$ 101/62  Pulse: 86 78 96 84  Resp: '17 18 19 18  '$ Temp: 98.2 F (36.8 C) 98 F (36.7 C) 98.7 F (37.1 C) 98.3 F (36.8 C)  TempSrc: Oral Oral Oral Oral  SpO2: 100% 98% 97% 100%  Weight:      Height:        General: Pt is alert, awake, not in acute distress Cardiovascular: RRR, S1/S2 +, no rubs, no gallops Respiratory: CTA bilaterally, no wheezing, no rhonchi Abdominal: Soft, NT, ND, bowel sounds + Extremities: no edema, no cyanosis    The results of significant diagnostics from this hospitalization (including  imaging, microbiology, ancillary and laboratory) are listed below for reference.     Microbiology: Recent Results (from the past 240 hour(s))  SARS CORONAVIRUS 2 (TAT 6-24 HRS) Anterior Nasal Swab     Status: None   Collection Time: 06/15/22  4:58 PM   Specimen: Anterior Nasal  Swab  Result Value Ref Range Status   SARS Coronavirus 2 NEGATIVE NEGATIVE Final    Comment: (NOTE) SARS-CoV-2 target nucleic acids are NOT DETECTED.  The SARS-CoV-2 RNA is generally detectable in upper and lower respiratory specimens during the acute phase of infection. Negative results do not preclude SARS-CoV-2 infection, do not rule out co-infections with other pathogens, and should not be used as the sole basis for treatment or other patient management decisions. Negative results must be combined with clinical observations, patient history, and epidemiological information. The expected result is Negative.  Fact Sheet for Patients: SugarRoll.be  Fact Sheet for Healthcare Providers: https://www.woods-mathews.com/  This test is not yet approved or cleared by the [place] FDA and  has been authorized for detection and/or diagnosis of SARS-CoV-2 by FDA under an Emergency Use Authorization (EUA). This EUA will remain  in effect (meaning this test can be used) for the duration of the COVID-19 declaration under Se ction 564(b)(1) of the Act, 21 U.S.C. section 360bbb-3(b)(1), unless the authorization is terminated or revoked sooner.  Performed at [facility], [place] [address] [place], [place] [address]   Resp panel by RT-PCR (RSV, Flu A&B, Covid) Anterior Nasal Swab     Status: None   Collection Time: 06/15/22  7:08 PM   Specimen: Anterior Nasal Swab  Result Value Ref Range Status   SARS Coronavirus 2 by RT PCR NEGATIVE NEGATIVE Final    Comment: (NOTE) SARS-CoV-2 target nucleic acids are NOT DETECTED.  The SARS-CoV-2 RNA is generally detectable in upper respiratory specimens during the acute phase of infection. The lowest concentration of SARS-CoV-2 viral copies this assay can detect is 138 copies/mL. A negative result does not preclude SARS-Cov-2 infection and should not be used as the sole basis for treatment or other  patient management decisions. A negative result may occur with  improper specimen collection/handling, submission of specimen other than nasopharyngeal swab, presence of viral mutation(s) within the areas targeted by this assay, and inadequate number of viral copies(<138 copies/mL). A negative result must be combined with clinical observations, patient history, and epidemiological information. The expected result is Negative.  Fact Sheet for Patients:  EntrepreneurPulse.com.au  Fact Sheet for Healthcare Providers:  IncredibleEmployment.be  This test is no t yet approved or cleared by the [place] FDA and  has been authorized for detection and/or diagnosis of SARS-CoV-2 by FDA under an Emergency Use Authorization (EUA). This EUA will remain  in effect (meaning this test can be used) for the duration of the COVID-19 declaration under Section 564(b)(1) of the Act, 21 U.S.C.section 360bbb-3(b)(1), unless the authorization is terminated  or revoked sooner.       Influenza A by PCR NEGATIVE NEGATIVE Final   Influenza B by PCR NEGATIVE NEGATIVE Final    Comment: (NOTE) The Xpert Xpress SARS-CoV-2/FLU/RSV plus assay is intended as an aid in the diagnosis of influenza from Nasopharyngeal swab specimens and should not be used as a sole basis for treatment. Nasal washings and aspirates are unacceptable for Xpert Xpress SARS-CoV-2/FLU/RSV testing.  Fact Sheet for Patients: EntrepreneurPulse.com.au  Fact Sheet for Healthcare Providers: IncredibleEmployment.be  This test is not yet approved or cleared by the [place]  FDA and has been authorized for detection and/or diagnosis of SARS-CoV-2 by FDA under an Emergency Use Authorization (EUA). This EUA will remain in effect (meaning this test can be used) for the duration of the COVID-19 declaration under Section 564(b)(1) of the Act, 21 U.S.C. section  360bbb-3(b)(1), unless the authorization is terminated or revoked.     Resp Syncytial Virus by PCR NEGATIVE NEGATIVE Final    Comment: (NOTE) Fact Sheet for Patients: EntrepreneurPulse.com.au  Fact Sheet for Healthcare Providers: IncredibleEmployment.be  This test is not yet approved or cleared by the [place] FDA and has been authorized for detection and/or diagnosis of SARS-CoV-2 by FDA under an Emergency Use Authorization (EUA). This EUA will remain in effect (meaning this test can be used) for the duration of the COVID-19 declaration under Section 564(b)(1) of the Act, 21 U.S.C. section 360bbb-3(b)(1), unless the authorization is terminated or revoked.  Performed at [facility], [address] [place], [place] [address]   Blood culture (routine x 2)     Status: None (Preliminary result)   Collection Time: 06/16/22 12:01 AM   Specimen: BLOOD  Result Value Ref Range Status   Specimen Description   Final    BLOOD LEFT ANTECUBITAL Performed at [facility], [address] [place], [place] [address]    Special Requests   Final    BOTTLES DRAWN AEROBIC AND ANAEROBIC Blood Culture adequate volume Performed at [facility], [address] [place], [place] [address]    Culture   Final    NO GROWTH 2 DAYS Performed at [facility], [place] [address] [place], [place] [address]    Report Status PENDING  Incomplete  Blood culture (routine x 2)     Status: None (Preliminary result)   Collection Time: 06/16/22  6:04 PM   Specimen: BLOOD LEFT ARM  Result Value Ref Range Status   Specimen Description BLOOD LEFT ARM  Final   Special Requests   Final    BOTTLES DRAWN AEROBIC AND ANAEROBIC Blood Culture adequate volume   Culture   Final    NO GROWTH 2 DAYS Performed at [facility], [place] [address], [place], [place] [address]    Report Status PENDING  Incomplete  CSF culture w Gram Stain      Status: None (Preliminary result)   Collection Time: 06/17/22 10:51 AM   Specimen: PATH Cytology CSF; Cerebrospinal Fluid  Result Value Ref Range Status   Specimen Description CSF  Final   Special Requests NONE  Final   Gram Stain NO WBC SEEN NO ORGANISMS SEEN CYTOSPIN SMEAR   Final   Culture   Final    NO GROWTH < 24 HOURS Performed at [facility], [place] [address], [place], [place] [address]    Report Status PENDING  Incomplete     Labs: BNP (last 3 results) No results for input(s): "BNP" in the last 8760 hours. Basic Metabolic Panel: Recent Labs  Lab 06/15/22 1908 06/16/22 1752 06/17/22 0810 06/18/22 0439  NA 131*  --  137 135  K 3.8  --  3.4* 3.8  CL 101  --  104 106  CO2 22  --  24 19*  GLUCOSE 83  --  86 97  BUN 8  --  8 6  CREATININE 0.91 0.93 0.81 0.88  CALCIUM 8.7*  --  8.4* 9.0   Liver Function Tests: Recent Labs  Lab 06/15/22 1908  AST 29  ALT 17  ALKPHOS 52  BILITOT 0.8  PROT 8.1  ALBUMIN 4.2   No results for input(s): "LIPASE", "AMYLASE" in the last 168 hours. No results for input(s): "AMMONIA" in the last 168 hours. CBC: Recent Labs  Lab 06/15/22 1827 06/15/22 1908 06/16/22 1752 06/17/22 0810  WBC 6.1 6.5 3.8* 3.3*  NEUTROABS 4.9 4.9  --   --   HGB 13.6 13.2 12.7 12.0  HCT 40.2 39.3 37.2 35.4*  MCV 87.8 86.6 87.5 87.2  PLT 278 281 229 233   Cardiac Enzymes: No results for input(s): "CKTOTAL", "CKMB", "CKMBINDEX", "TROPONINI" in the last 168 hours. BNP: Invalid input(s): "POCBNP" CBG: No results for input(s): "GLUCAP" in the last 168 hours. D-Dimer No results for input(s): "DDIMER" in the last 72 hours. Hgb A1c No results for input(s): "HGBA1C" in the last 72 hours. Lipid Profile No results for input(s): "CHOL", "HDL", "LDLCALC", "TRIG", "CHOLHDL", "LDLDIRECT" in the last 72 hours. Thyroid function studies No results for input(s): "TSH", "T4TOTAL", "T3FREE", "THYROIDAB" in the last 72 hours.  Invalid input(s):  "FREET3" Anemia work up No results for input(s): "VITAMINB12", "FOLATE", "FERRITIN", "TIBC", "IRON", "RETICCTPCT" in the last 72 hours. Urinalysis    Component Value Date/Time   COLORURINE YELLOW 06/11/2021 2249   APPEARANCEUR CLEAR 06/11/2021 2249   LABSPEC 1.020 06/11/2021 2249   PHURINE 7.5 06/11/2021 2249   GLUCOSEU NEGATIVE 06/11/2021 2249   HGBUR NEGATIVE 06/11/2021 2249   BILIRUBINUR NEGATIVE 06/11/2021 2249   KETONESUR NEGATIVE 06/11/2021 2249   PROTEINUR NEGATIVE 06/11/2021 2249   UROBILINOGEN 1.0 08/18/2013 1315   NITRITE NEGATIVE 06/11/2021 2249   LEUKOCYTESUR NEGATIVE 06/11/2021 2249   Sepsis Labs Recent Labs  Lab 06/15/22 1827 06/15/22 1908 06/16/22 1752 06/17/22 0810  WBC 6.1 6.5 3.8* 3.3*   Microbiology Recent Results (from the past 240 hour(s))  SARS CORONAVIRUS 2 (TAT 6-24 HRS) Anterior Nasal Swab     Status: None   Collection Time: 06/15/22  4:58 PM   Specimen: Anterior Nasal Swab  Result Value Ref Range Status   SARS Coronavirus 2 NEGATIVE NEGATIVE Final    Comment: (NOTE) SARS-CoV-2 target nucleic acids are NOT DETECTED.  The SARS-CoV-2 RNA is generally detectable in upper and lower respiratory specimens during the acute phase of infection. Negative results do not preclude SARS-CoV-2 infection, do not rule out co-infections with other pathogens, and should not be used as the sole basis for treatment or other patient management decisions. Negative results must be combined with clinical observations, patient history, and epidemiological information. The expected result is Negative.  Fact Sheet for Patients: SugarRoll.be  Fact Sheet for Healthcare Providers: https://www.woods-mathews.com/  This test is not yet approved or cleared by the [place] FDA and  has been authorized for detection and/or diagnosis of SARS-CoV-2 by FDA under an Emergency Use Authorization (EUA). This EUA will remain  in effect  (meaning this test can be used) for the duration of the COVID-19 declaration under Se ction 564(b)(1) of the Act, 21 U.S.C. section 360bbb-3(b)(1), unless the authorization is terminated or revoked sooner.  Performed at [facility], [place] [address] [place], [place] [address]   Resp panel by RT-PCR (RSV, Flu A&B, Covid) Anterior Nasal Swab     Status: None   Collection Time: 06/15/22  7:08 PM   Specimen: Anterior Nasal Swab  Result Value Ref Range Status   SARS Coronavirus 2 by RT PCR NEGATIVE NEGATIVE Final    Comment: (NOTE) SARS-CoV-2 target nucleic acids are NOT  DETECTED.  The SARS-CoV-2 RNA is generally detectable in upper respiratory specimens during the acute phase of infection. The lowest concentration of SARS-CoV-2 viral copies this assay can detect is 138 copies/mL. A negative result does not preclude SARS-Cov-2 infection and should not be used as the sole basis for treatment or other patient management decisions. A negative result may occur with  improper specimen collection/handling, submission of specimen other than nasopharyngeal swab, presence of viral mutation(s) within the areas targeted by this assay, and inadequate number of viral copies(<138 copies/mL). A negative result must be combined with clinical observations, patient history, and epidemiological information. The expected result is Negative.  Fact Sheet for Patients:  EntrepreneurPulse.com.au  Fact Sheet for Healthcare Providers:  IncredibleEmployment.be  This test is no t yet approved or cleared by the [place] FDA and  has been authorized for detection and/or diagnosis of SARS-CoV-2 by FDA under an Emergency Use Authorization (EUA). This EUA will remain  in effect (meaning this test can be used) for the duration of the COVID-19 declaration under Section 564(b)(1) of the Act, 21 U.S.C.section 360bbb-3(b)(1), unless the authorization is terminated  or  revoked sooner.       Influenza A by PCR NEGATIVE NEGATIVE Final   Influenza B by PCR NEGATIVE NEGATIVE Final    Comment: (NOTE) The Xpert Xpress SARS-CoV-2/FLU/RSV plus assay is intended as an aid in the diagnosis of influenza from Nasopharyngeal swab specimens and should not be used as a sole basis for treatment. Nasal washings and aspirates are unacceptable for Xpert Xpress SARS-CoV-2/FLU/RSV testing.  Fact Sheet for Patients: EntrepreneurPulse.com.au  Fact Sheet for Healthcare Providers: IncredibleEmployment.be  This test is not yet approved or cleared by the [place] FDA and has been authorized for detection and/or diagnosis of SARS-CoV-2 by FDA under an Emergency Use Authorization (EUA). This EUA will remain in effect (meaning this test can be used) for the duration of the COVID-19 declaration under Section 564(b)(1) of the Act, 21 U.S.C. section 360bbb-3(b)(1), unless the authorization is terminated or revoked.     Resp Syncytial Virus by PCR NEGATIVE NEGATIVE Final    Comment: (NOTE) Fact Sheet for Patients: EntrepreneurPulse.com.au  Fact Sheet for Healthcare Providers: IncredibleEmployment.be  This test is not yet approved or cleared by the [place] FDA and has been authorized for detection and/or diagnosis of SARS-CoV-2 by FDA under an Emergency Use Authorization (EUA). This EUA will remain in effect (meaning this test can be used) for the duration of the COVID-19 declaration under Section 564(b)(1) of the Act, 21 U.S.C. section 360bbb-3(b)(1), unless the authorization is terminated or revoked.  Performed at [facility], [address] [place], [place] [address]   Blood culture (routine x 2)     Status: None (Preliminary result)   Collection Time: 06/16/22 12:01 AM   Specimen: BLOOD  Result Value Ref Range Status   Specimen Description   Final    BLOOD LEFT  ANTECUBITAL Performed at [facility], [address] [place], [place] [address]    Special Requests   Final    BOTTLES DRAWN AEROBIC AND ANAEROBIC Blood Culture adequate volume Performed at [facility], [address] [place], [place] [address]    Culture   Final    NO GROWTH 2 DAYS Performed at [facility], [place] [address], [place], [place] [address]    Report Status PENDING  Incomplete  Blood culture (routine x 2)     Status:  None (Preliminary result)   Collection Time: 06/16/22  6:04 PM   Specimen: BLOOD LEFT ARM  Result Value Ref Range Status   Specimen Description BLOOD LEFT ARM  Final   Special Requests   Final    BOTTLES DRAWN AEROBIC AND ANAEROBIC Blood Culture adequate volume   Culture   Final    NO GROWTH 2 DAYS Performed at [facility], [address] [place], [place] [address]    Report Status PENDING  Incomplete  CSF culture w Gram Stain     Status: None (Preliminary result)   Collection Time: 06/17/22 10:51 AM   Specimen: PATH Cytology CSF; Cerebrospinal Fluid  Result Value Ref Range Status   Specimen Description CSF  Final   Special Requests NONE  Final   Gram Stain NO WBC SEEN NO ORGANISMS SEEN CYTOSPIN SMEAR   Final   Culture   Final    NO GROWTH < 24 HOURS Performed at [facility], [place] [address] [place], [place] [address]    Report Status PENDING  Incomplete     SIGNED:   Charlynne Cousins, MD  [facility] 06/18/2022, 9:48 AM Pager   If 7PM-7AM, please contact night-coverage [email or web address] Password [email or web address]

## 2022-06-18 NOTE — Progress Notes (Signed)
Neurology Progress Note  Patient ID: Marilyn Ramirez is a 27 y.o. with PMHx of  has a past medical history of Osteoarthritis of knee, Ovarian cyst, and Scoliosis.  Subjective: 27 yo female seen in her hospital bed with girlfriend at bedside.  She notes continued headaches, worse this a.m.  Notes pain 9/10 this morning.  Has already had headache cocktail this morning.  Notes the ice packs seem to help and it is on the back of her neck/head currently.  She is lying mostly flat.  She initially denies that position makes much of a difference in the headache.  But, when sat up in the bed does note the headache gets slightly worse.  She also endorses it worsens slightly with ambulation to the bathroom.  I asked the patient about her liver and organ meat consumption.  This is a meat they enjoy.  They eat it fairly frequently for breakfast.  When asked about the frequency they say it is 2-3 x/week.  He stepdad also cooks it for bfast sometimes on Sundays.    The bifrontal portion of her headache improved after LP from 10 to 6.  But, as noted above today is worse.   No further fevers. Not eating or drinking well- no appetite.  Exam: Vitals:   06/18/22 0420 06/18/22 0807  BP: 96/65 101/62  Pulse: 96 84  Resp: 19 18  Temp: 98.7 F (37.1 C) 98.3 F (36.8 C)  SpO2: 97% 100%   Temp:  [98 F (36.7 C)-98.8 F (37.1 C)] 98.3 F (36.8 C) (02/24 0807) Pulse Rate:  [77-102] 84 (02/24 0807) Resp:  [16-19] 18 (02/24 0807) BP: (95-119)/(60-74) 101/62 (02/24 0807) SpO2:  [97 %-100 %] 100 % (02/24 0807)  General - Well nourished, well developed, in no apparent distress. HEENT- atraumatic, normocephalic. PERRL Cardiovascular - Regular rhythm and rate, no m/r/g Pulmonary- equal chest rise; unlabored respirations. CTA.  Mental Status -  Alert and awake.  Orientation to time, place and person intact.   Language intact. Speech intact . Attention span and concentration were normal. Recent and remote memory  were intact. Fund of Knowledge was assessed and was intact.  Cranial Nerves II - XII - II - Visual field intact OU. III, IV, VI - Extraocular movements intact. V - Facial sensation intact bilaterally. VII - Facial movement intact bilaterally. VIII - Hearing intact to speech XII - Tongue protrusion intact.  Motor Strength - The patient's strength was normal in all extremities and pronator drift was absent.  Bulk was normal and fasciculations were absent.    Sensory - Light touch sensation assessed and were symmetrical.    Gait and Station - deferred.    Pertinent Labs: Meningitis/encephalitis panel negative   Latest Reference Range & Units 06/17/22 10:51  Appearance, CSF CLEAR  CLEAR !  Glucose, CSF 40 - 70 mg/dL 55  RBC Count, CSF 0 /cu mm 6 (H)  WBC, CSF 0 - 5 /cu mm 1  Other Cells, CSF  TOO FEW TO COUNT, SMEAR AVAILABLE FOR REVIEW  Color, CSF COLORLESS  COLORLESS  Supernatant  NOT INDICATED  Total  Protein, CSF 15 - 45 mg/dL 23  Tube #  1    Basic Metabolic Panel: Recent Labs  Lab 06/15/22 1908 06/16/22 1752 06/17/22 0810 06/18/22 0439  NA 131*  --  137 135  K 3.8  --  3.4* 3.8  CL 101  --  104 106  CO2 22  --  24 19*  GLUCOSE 83  --  86 97  BUN 8  --  8 6  CREATININE 0.91 0.93 0.81 0.88  CALCIUM 8.7*  --  8.4* 9.0    CBC: Recent Labs  Lab 06/15/22 1827 06/15/22 1908 06/16/22 1752 06/17/22 0810  WBC 6.1 6.5 3.8* 3.3*  NEUTROABS 4.9 4.9  --   --   HGB 13.6 13.2 12.7 12.0  HCT 40.2 39.3 37.2 35.4*  MCV 87.8 86.6 87.5 87.2  PLT 278 281 229 233    Coagulation Studies: No results for input(s): "LABPROT", "INR" in the last 72 hours.    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  She notes she was relaxed.   Exam findings previously pertinent for possible low grade papilledema.  Still having having pain with vertical and lateral eye movements.  Only potential exacerbating  factor is liver consumption, high in Vitamin A  Migraines  ID has d/c'd antibiotics with meningitis ruled out and we agree.  Recommendations: - Scheduled tylenol and ketorolac - Stop compazine/benadryl - She does endorse liver/organ meat intake.  Will check Vit A level.  - Increase diamox 750 mg BID as she still has worsening bifrontal headache with laying flat - Continue Ice packs to the back of the left side of the head for symptomatic relief of occipital neuralgia which patients notes is helping; outpatient could pursue occipital nerve block (note this part of her headache sometimes worsens with laying flat, c/w pressure on the nerve) - Appreciate management of comorbidities per primary team, neurology will follow along  Avanell Shackleton  Attending Neurologist's note:  I personally saw this patient, gathering history, performing a full neurologic examination, reviewing relevant labs, personally reviewing relevant imaging, and formulated the assessment and plan, adding the note above for completeness and clarity to accurately reflect my thoughts   Lesleigh Noe MD-PhD [facility] [phone] Available 7 PM to 7 AM, outside of these hours please call Neurologist on call as listed on Amion.

## 2022-06-18 NOTE — Progress Notes (Signed)
Refused Lovenox. Informed what it was for and the risk of bld clots when not taken. Pt continued to refuse. Encouraged to walk more and move legs more in bed. Also encouraged to drink more water r/t Diamox and now the dose has been increased. Voiced understanding

## 2022-06-19 LAB — BASIC METABOLIC PANEL
Anion gap: 6 (ref 5–15)
BUN: 8 mg/dL (ref 6–20)
CO2: 19 mmol/L — ABNORMAL LOW (ref 22–32)
Calcium: 8.5 mg/dL — ABNORMAL LOW (ref 8.9–10.3)
Chloride: 109 mmol/L (ref 98–111)
Creatinine, Ser: 0.94 mg/dL (ref 0.44–1.00)
GFR, Estimated: >60 mL/min (ref >60)
Glucose, Bld: 97 mg/dL (ref 70–99)
Potassium: 3.7 mmol/L (ref 3.5–5.1)
Sodium: 134 mmol/L — ABNORMAL LOW (ref 135–145)

## 2022-06-19 MED ORDER — ACETAMINOPHEN 325 MG PO TABS: 650.0000 mg | ORAL_TABLET | Freq: Four times a day (QID) | ORAL | Status: DC | PRN

## 2022-06-19 MED ORDER — ACETAZOLAMIDE 250 MG PO TABS: 750.0000 mg | ORAL_TABLET | Freq: Two times a day (BID) | ORAL | 3 refills | Status: DC

## 2022-06-19 MED ORDER — IBUPROFEN 200 MG PO TABS: 400.0000 mg | ORAL_TABLET | Freq: Four times a day (QID) | ORAL | Status: DC | PRN

## 2022-06-19 MED ORDER — SENNOSIDES-DOCUSATE SODIUM 8.6-50 MG PO TABS
2.0000 | ORAL_TABLET | Freq: Once | ORAL | Status: AC
  Administered 2022-06-19: 2 via ORAL
  Filled 2022-06-19: qty 2

## 2022-06-19 MED ORDER — POLYETHYLENE GLYCOL 3350 17 G PO PACK: 17.0000 g | PACK | Freq: Two times a day (BID) | ORAL | 0 refills | Status: DC

## 2022-06-19 NOTE — Progress Notes (Signed)
Patient ready for discharge to home; discharge instructions given and reviewed; Rx's sent electronically; patient dressed and ready to discharge; patient discharged out via wheelchair accompanied home by her girlfriend.

## 2022-06-19 NOTE — Progress Notes (Signed)
Patient awake, sitting up in bed with emesis basin; has not vomited; medicated prn for nausea. Encouraged po's when tolerated.

## 2022-06-19 NOTE — Progress Notes (Signed)
Vital signs obtained; patient did not vomit; medication prn helped; reports headache to the back of the head; flat affect; wants to go home; offered po's and she declines; friend at bedside is going to order the breakfast; po's encouraged.

## 2022-06-19 NOTE — Progress Notes (Signed)
Pt states pain meds are only helping a little bit. Pt slept well tonight and has drank very little. Hasn't been up to BR all night and states she doesn't feel like she has to go. Again encouraged pt to drink more fluids especially water to help with her HA and to keep from getting dehydrated. Pt voiced understanding

## 2022-06-19 NOTE — Progress Notes (Addendum)
Patient has been sleeping through out the morning hours; has not voided in over 6-8 hours; assisted to the bathroom; upon leaving the bathroom she c/o not feeling well; light headed; increased head pain; nausea was biggest compliant and epigastric pain/cramping ; returned to bed safely and medicated prn for nausea; MD notified of events.

## 2022-06-19 NOTE — Progress Notes (Addendum)
Neurology Progress Note  Patient ID: Marilyn Ramirez is a 27 y.o. with PMHx of  has a past medical history of Osteoarthritis of knee, Ovarian cyst, and Scoliosis.  Subjective: 27 yo female seen today in follow-up lying in her hospital bed.  Initially upon entering the room today the patient is crying.  Her girlfriend is consoling her on the bedside.  Eventually she stops crying and tells me she is okay.  She notes that she continues to have the irritating headache pain in the back of her head but no frontal headache this morning.  She did sleep with ice again last night and felt that that was helpful.  She still is not eating or drinking well.  She tells me she has no appetite.  We did review how important again the hydration is.  She feels that having still not moved her bowels is contributing to her ability to eat and drink.  We discussed that MiraLAX needs fluids to work and that hydration is a must.  No fevers.  She has been receiving scheduled Tylenol for her headache.   Exam: Vitals:   06/19/22 0344 06/19/22 0830  BP: 94/60 (!) 84/69  Pulse: 77 85  Resp: 16 16  Temp: 97.6 F (36.4 C) 98.5 F (36.9 C)  SpO2: 100% 100%   Temp:  [97.6 F (36.4 C)-98.5 F (36.9 C)] 98.5 F (36.9 C) (02/25 0830) Pulse Rate:  [77-94] 85 (02/25 0830) Resp:  [16-18] 16 (02/25 0830) BP: (84-110)/(51-75) 84/69 (02/25 0830) SpO2:  [100 %] 100 % (02/25 0830)  General - Well nourished, well developed, in no apparent distress. HEENT- atraumatic, normocephalic.  Pulmonary- equal chest rise; unlabored respirations.   Neuro Patient is awake and alert.  Oriented x 4.  Language and speech are intact.  No focal deficits.  Motor exam normal throughout.  Sensation intact.  Gait not observed but has been ambulating back and forth to the bathroom without difficulty.   Pertinent Labs: Meningitis/encephalitis panel negative   Latest Reference Range & Units 06/17/22 10:51  Appearance, CSF CLEAR  CLEAR !  Glucose,  CSF 40 - 70 mg/dL 55  RBC Count, CSF 0 /cu mm 6 (H)  WBC, CSF 0 - 5 /cu mm 1  Other Cells, CSF  TOO FEW TO COUNT, SMEAR AVAILABLE FOR REVIEW  Color, CSF COLORLESS  COLORLESS  Supernatant  NOT INDICATED  Total  Protein, CSF 15 - 45 mg/dL 23  Tube #  1    Basic Metabolic Panel: Recent Labs  Lab 06/15/22 1908 06/16/22 1752 06/17/22 0810 06/18/22 0439 06/19/22 0349  NA 131*  --  137 135 134*  K 3.8  --  3.4* 3.8 3.7  CL 101  --  104 106 109  CO2 22  --  24 19* 19*  GLUCOSE 83  --  86 97 97  BUN 8  --  '8 6 8  '$ CREATININE 0.91 0.93 0.81 0.88 0.94  CALCIUM 8.7*  --  8.4* 9.0 8.5*    CBC: Recent Labs  Lab 06/15/22 1827 06/15/22 1908 06/16/22 1752 06/17/22 0810  WBC 6.1 6.5 3.8* 3.3*  NEUTROABS 4.9 4.9  --   --   HGB 13.6 13.2 12.7 12.0  HCT 40.2 39.3 37.2 35.4*  MCV 87.8 86.6 87.5 87.2  PLT 278 281 229 233    Coagulation Studies: No results for input(s): "LABPROT", "INR" in the last 72 hours.    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  She notes she was relaxed.   Exam findings previously pertinent for possible low grade papilledema.   Only potential exacerbating factor is liver consumption, high in Vitamin A  Migraines  ID has d/c'd antibiotics with meningitis ruled out and we agree.  Recommendations: -I will stop her scheduled Tylenol and Toradol as she has not felt this has been very helpful. -Vitamin A level is still pending but we did ask her to discontinue all liver/organ meat indefinitely at this time. -Continue Diamox 750 mg twice daily. -Follow-up with [facility] at discharge. -Okay for discharge today from neurological perspective.  [place] of care reviewed with attending, Dr. Curly [place], and she agrees with the plan.  Patient discussed with NP by myself, not seen by myself, but plan reviewed, billing per NP

## 2022-06-19 NOTE — Discharge Summary (Signed)
Physician Discharge Summary  Marilyn Ramirez [id number] DOB: 1995/06/12 DOA: 06/15/2022  PCP: Patient, No Pcp Per  Admit date: 06/15/2022 Discharge date: 06/19/2022  Admitted From: Home Disposition:  Home  Recommendations for Outpatient Follow-up:  Follow up with PCP in 1-2 weeks Please obtain BMP/CBC in one week   Home Health:No Equipment/Devices:None  Discharge Condition:Stable CODE STATUS:Full Diet recommendation: Heart Healthy   Brief/Interim Summary:  27 y.o. female past medical history of tooth infection comes in with headache that started 2 days prior to admission fever 103 not able to take oral medications at home.   Discharge Diagnoses:  Principal Problem:   Headache Active Problems:   Tooth infection  Migrainous headaches: There was a concern for acute viral meningitis she was started empirical antibiotic Rocephin and Aciclovir, LP was performed which was negative.  Infectious disease was consulted she remained afebrile no leukocytosis labs were unremarkable vitals were discontinued.  Meningitis was ruled out. Neurology was consulted recommended treated with a migraine cocktail by the next day her headaches were not improved, it took about 2 days for HA's to improve. Neurology recommended to continue Diamox as an outpatient. She will follow-up with neurology as an outpatient. There was a concern of low-grade peripheral edema neurology recommended start Diamox she continue as an outpatient.  Discharge Instructions  Discharge Instructions     Ambulatory referral to Neurology   Complete by: As directed    An appointment is requested in approximately: 4 - 8 weeks with either Dr. Jaynee Eagles or Dr. Billey Gosling (whomever has earlier availability)   Ambulatory referral to Neurology   Complete by: As directed    An appointment is requested in approximately: 2 weeks with one of headache specialists   Diet - low sodium heart healthy   Complete by: As directed    Diet - low sodium  heart healthy   Complete by: As directed    Increase activity slowly   Complete by: As directed    Increase activity slowly   Complete by: As directed       Allergies as of 06/19/2022   No Known Allergies      Medication List     TAKE these medications    acetaZOLAMIDE 250 MG tablet Commonly known as: DIAMOX Take 3 tablets (750 mg total) by mouth 2 (two) times daily.   ibuprofen 400 MG tablet Commonly known as: ADVIL Take 1 tablet (400 mg total) by mouth every 6 (six) hours as needed for mild pain (or Fever >/= 101).   ondansetron 4 MG disintegrating tablet Commonly known as: ZOFRAN-ODT Take 1 tablet (4 mg total) by mouth every 8 (eight) hours as needed for nausea or vomiting.   polyethylene glycol 17 g packet Commonly known as: MIRALAX / GLYCOLAX Take 17 g by mouth 2 (two) times daily.        Follow-up Information     Schedule an appointment as soon as possible for a visit  with [facility] with your PCP/Specialist as discussed.   Contact information: [email or web address] Call our physician referral line at [phone].               No Known Allergies  Consultations: Neurology Interventional radiology Infectious disease   Procedures/Studies: DG FL GUIDED LUMBAR PUNCTURE  Result Date: 06/17/2022 CLINICAL DATA:  Provided history: Headache. Additional history: Fever. Concern for meningitis. EXAM: DIAGNOSTIC LUMBAR PUNCTURE UNDER FLUOROSCOPIC GUIDANCE COMPARISON:  CT abdomen/pelvis 06/11/2021. FLUOROSCOPY: Fluoroscopy time: 28 seconds (1 mGy). PROCEDURE: Mady Gemma, PA-C obtained informed consent from  the patient prior to the procedure. This process included a discussion of procedural risks. The patient was positioned prone on the fluoroscopy table. An appropriate skin entry site was determined under fluoroscopy and marked. The operator donned sterile gloves and a mask. A time-out was performed. The lower back was prepped and draped  in the usual sterile fashion. 1% lidocaine was used for local anesthesia. Lumbar puncture was performed at the L4-L5 level using a 20 gauge spinal needle with return of clear CSF with an opening pressure of 35 cmH2O. 14 mL of CSF were collected for laboratory studies. There was a closing pressure of 14 cmH2O. The needle was removed and a sterile dressing was applied to the skin entry site. The patient tolerated the procedure well. No immediate post-procedure complication was apparent. The procedure was performed by Mady Gemma, PA-C, supervised by Dr. Kellie Simmering. IMPRESSION: 1. Technically successful fluoroscopically-guided L4-L5 lumbar puncture. 2. Opening pressure: 35 cmH2O 3. 14 mL of CSF obtained for laboratory studies. 4. Closing pressure: 14 cmH2O. 5. No immediate post-procedure complication. Electronically Signed   By: Kellie Simmering D.O.   On: 06/17/2022 12:01   CT MAXILLOFACIAL W CONTRAST  Result Date: 06/16/2022 CLINICAL DATA:  Tooth pain EXAM: CT MAXILLOFACIAL WITH CONTRAST TECHNIQUE: Multidetector CT imaging of the maxillofacial structures was performed with intravenous contrast. Multiplanar CT image reconstructions were also generated. RADIATION DOSE REDUCTION: This exam was performed according to the departmental dose-optimization program which includes automated exposure control, adjustment of the mA and/or kV according to patient size and/or use of iterative reconstruction technique. CONTRAST:  36m OMNIPAQUE IOHEXOL 350 MG/ML SOLN COMPARISON:  CT Head 06/15/22 FINDINGS: Osseous: No fracture or mandibular dislocation. No destructive process. Presence of streak artifact from dental amalgam slightly limits the ability to assess for presence dental caries. No large periapical lucency. Orbits: Negative. No traumatic or inflammatory finding. Sinuses: No mastoid or middle ear effusion.1 paranasal sinuses are clear. Soft tissues: Negative. Limited intracranial: No significant or unexpected finding.  IMPRESSION: Presence of streak artifact from dental amalgam slightly limits the ability to assess for presence dental caries. This limitation, no large periapical lucency or odontogenic soft tissue abscess visualized. Electronically Signed   By: HMarin RobertsM.D.   On: 06/16/2022 20:12   CT HEAD WO CONTRAST (5MM)  Result Date: 06/15/2022 CLINICAL DATA:  Headache and fever. EXAM: CT HEAD WITHOUT CONTRAST TECHNIQUE: Contiguous axial images were obtained from the base of the skull through the vertex without intravenous contrast. RADIATION DOSE REDUCTION: This exam was performed according to the departmental dose-optimization program which includes automated exposure control, adjustment of the mA and/or kV according to patient size and/or use of iterative reconstruction technique. COMPARISON:  Head CT dated 12/11/2018. FINDINGS: Brain: The ventricles and sulci are appropriate size for the patient's age. The gray-white matter discrimination is preserved. There is no acute intracranial hemorrhage. No mass effect or midline shift. No extra-axial fluid collection. Vascular: No hyperdense vessel or unexpected calcification. Skull: Normal. Negative for fracture or focal lesion. Sinuses/Orbits: Mild mucoperiosteal thickening of paranasal sinuses. No air-fluid level. Mastoid air cells are clear. Other: None IMPRESSION: Unremarkable noncontrast CT of the brain. Electronically Signed   By: AAnner CreteM.D.   On: 06/15/2022 22:40   (Echo, Carotid, EGD, Colonoscopy, ERCP)    Subjective:   Discharge Exam: Vitals:   06/19/22 0344 06/19/22 0830  BP: 94/60 (!) 84/69  Pulse: 77 85  Resp: 16 16  Temp: 97.6 F (36.4 C) 98.5 F (36.9 C)  SpO2: 100% 100%   Vitals:   06/18/22 2109 06/19/22 0034 06/19/22 0344 06/19/22 0830  BP: 99/64 (!) 90/51 94/60 (!) 84/69  Pulse: 84 80 77 85  Resp: '18 16 16 16  '$ Temp: 97.8 F (36.6 C) 97.8 F (36.6 C) 97.6 F (36.4 C) 98.5 F (36.9 C)  TempSrc: Oral Oral Oral Oral   SpO2: 100% 100% 100% 100%  Weight:      Height:        General: Pt is alert, awake, not in acute distress Cardiovascular: RRR, S1/S2 +, no rubs, no gallops Respiratory: CTA bilaterally, no wheezing, no rhonchi Abdominal: Soft, NT, ND, bowel sounds + Extremities: no edema, no cyanosis    The results of significant diagnostics from this hospitalization (including imaging, microbiology, ancillary and laboratory) are listed below for reference.     Microbiology: Recent Results (from the past 240 hour(s))  SARS CORONAVIRUS 2 (TAT 6-24 HRS) Anterior Nasal Swab     Status: None   Collection Time: 06/15/22  4:58 PM   Specimen: Anterior Nasal Swab  Result Value Ref Range Status   SARS Coronavirus 2 NEGATIVE NEGATIVE Final    Comment: (NOTE) SARS-CoV-2 target nucleic acids are NOT DETECTED.  The SARS-CoV-2 RNA is generally detectable in upper and lower respiratory specimens during the acute phase of infection. Negative results do not preclude SARS-CoV-2 infection, do not rule out co-infections with other pathogens, and should not be used as the sole basis for treatment or other patient management decisions. Negative results must be combined with clinical observations, patient history, and epidemiological information. The expected result is Negative.  Fact Sheet for Patients: SugarRoll.be  Fact Sheet for Healthcare Providers: https://www.woods-mathews.com/  This test is not yet approved or cleared by the [place] FDA and  has been authorized for detection and/or diagnosis of SARS-CoV-2 by FDA under an Emergency Use Authorization (EUA). This EUA will remain  in effect (meaning this test can be used) for the duration of the COVID-19 declaration under Se ction 564(b)(1) of the Act, 21 U.S.C. section 360bbb-3(b)(1), unless the authorization is terminated or revoked sooner.  Performed at [facility], [place] [address],  [place], [place] [address]   Resp panel by RT-PCR (RSV, Flu A&B, Covid) Anterior Nasal Swab     Status: None   Collection Time: 06/15/22  7:08 PM   Specimen: Anterior Nasal Swab  Result Value Ref Range Status   SARS Coronavirus 2 by RT PCR NEGATIVE NEGATIVE Final    Comment: (NOTE) SARS-CoV-2 target nucleic acids are NOT DETECTED.  The SARS-CoV-2 RNA is generally detectable in upper respiratory specimens during the acute phase of infection. The lowest concentration of SARS-CoV-2 viral copies this assay can detect is 138 copies/mL. A negative result does not preclude SARS-Cov-2 infection and should not be used as the sole basis for treatment or other patient management decisions. A negative result may occur with  improper specimen collection/handling, submission of specimen other than nasopharyngeal swab, presence of viral mutation(s) within the areas targeted by this assay, and inadequate number of viral copies(<138 copies/mL). A negative result must be combined with clinical observations, patient history, and epidemiological information. The expected result is Negative.  Fact Sheet for Patients:  EntrepreneurPulse.com.au  Fact Sheet for Healthcare Providers:  IncredibleEmployment.be  This test is no t yet approved or cleared by the [place] FDA and  has been authorized for detection and/or diagnosis of SARS-CoV-2 by FDA under an Emergency Use Authorization (EUA). This EUA will remain  in effect (meaning this test can be used) for the duration of the COVID-19 declaration under Section 564(b)(1) of the Act, 21 U.S.C.section 360bbb-3(b)(1), unless the authorization is terminated  or revoked sooner.       Influenza A by PCR NEGATIVE NEGATIVE Final   Influenza B by PCR NEGATIVE NEGATIVE Final    Comment: (NOTE) The Xpert Xpress SARS-CoV-2/FLU/RSV plus assay is intended as an aid in the diagnosis of influenza from Nasopharyngeal swab specimens  and should not be used as a sole basis for treatment. Nasal washings and aspirates are unacceptable for Xpert Xpress SARS-CoV-2/FLU/RSV testing.  Fact Sheet for Patients: EntrepreneurPulse.com.au  Fact Sheet for Healthcare Providers: IncredibleEmployment.be  This test is not yet approved or cleared by the [place] FDA and has been authorized for detection and/or diagnosis of SARS-CoV-2 by FDA under an Emergency Use Authorization (EUA). This EUA will remain in effect (meaning this test can be used) for the duration of the COVID-19 declaration under Section 564(b)(1) of the Act, 21 U.S.C. section 360bbb-3(b)(1), unless the authorization is terminated or revoked.     Resp Syncytial Virus by PCR NEGATIVE NEGATIVE Final    Comment: (NOTE) Fact Sheet for Patients: EntrepreneurPulse.com.au  Fact Sheet for Healthcare Providers: IncredibleEmployment.be  This test is not yet approved or cleared by the [place] FDA and has been authorized for detection and/or diagnosis of SARS-CoV-2 by FDA under an Emergency Use Authorization (EUA). This EUA will remain in effect (meaning this test can be used) for the duration of the COVID-19 declaration under Section 564(b)(1) of the Act, 21 U.S.C. section 360bbb-3(b)(1), unless the authorization is terminated or revoked.  Performed at [facility], [address] [place], [place] [address]   Blood culture (routine x 2)     Status: None (Preliminary result)   Collection Time: 06/16/22 12:01 AM   Specimen: BLOOD  Result Value Ref Range Status   Specimen Description   Final    BLOOD LEFT ANTECUBITAL Performed at [facility], [address] [place], [place] [address]    Special Requests   Final    BOTTLES DRAWN AEROBIC AND ANAEROBIC Blood Culture adequate volume Performed at [facility], [address] [place], [place] [address]     Culture   Final    NO GROWTH 3 DAYS Performed at [facility], [place] [address], [place], [place] [address]    Report Status PENDING  Incomplete  Blood culture (routine x 2)     Status: None (Preliminary result)   Collection Time: 06/16/22  6:04 PM   Specimen: BLOOD LEFT ARM  Result Value Ref Range Status   Specimen Description BLOOD LEFT ARM  Final   Special Requests   Final    BOTTLES DRAWN AEROBIC AND ANAEROBIC Blood Culture adequate volume   Culture   Final    NO GROWTH 3 DAYS Performed at [facility], [place] [address] [place], [place] [address]    Report Status PENDING  Incomplete  CSF culture w Gram Stain     Status: None (Preliminary result)   Collection Time: 06/17/22 10:51 AM   Specimen: PATH Cytology CSF; Cerebrospinal Fluid  Result Value Ref Range Status   Specimen Description CSF  Final   Special Requests NONE  Final   Gram Stain NO WBC SEEN NO ORGANISMS SEEN CYTOSPIN SMEAR   Final   Culture   Final    NO GROWTH 2 DAYS Performed at  [facility], [place] [address] [place], [place] [address]    Report Status PENDING  Incomplete     Labs: BNP (last 3 results) No results for input(s): "BNP" in the last 8760 hours. Basic Metabolic Panel: Recent Labs  Lab 06/15/22 1908 06/16/22 1752 06/17/22 0810 06/18/22 0439 06/19/22 0349  NA 131*  --  137 135 134*  K 3.8  --  3.4* 3.8 3.7  CL 101  --  104 106 109  CO2 22  --  24 19* 19*  GLUCOSE 83  --  86 97 97  BUN 8  --  '8 6 8  '$ CREATININE 0.91 0.93 0.81 0.88 0.94  CALCIUM 8.7*  --  8.4* 9.0 8.5*    Liver Function Tests: Recent Labs  Lab 06/15/22 1908  AST 29  ALT 17  ALKPHOS 52  BILITOT 0.8  PROT 8.1  ALBUMIN 4.2    No results for input(s): "LIPASE", "AMYLASE" in the last 168 hours. No results for input(s): "AMMONIA" in the last 168 hours. CBC: Recent Labs  Lab 06/15/22 1827 06/15/22 1908 06/16/22 1752 06/17/22 0810  WBC 6.1 6.5 3.8* 3.3*  NEUTROABS 4.9 4.9  --   --   HGB 13.6  13.2 12.7 12.0  HCT 40.2 39.3 37.2 35.4*  MCV 87.8 86.6 87.5 87.2  PLT 278 281 229 233    Cardiac Enzymes: No results for input(s): "CKTOTAL", "CKMB", "CKMBINDEX", "TROPONINI" in the last 168 hours. BNP: Invalid input(s): "POCBNP" CBG: No results for input(s): "GLUCAP" in the last 168 hours. D-Dimer No results for input(s): "DDIMER" in the last 72 hours. Hgb A1c No results for input(s): "HGBA1C" in the last 72 hours. Lipid Profile No results for input(s): "CHOL", "HDL", "LDLCALC", "TRIG", "CHOLHDL", "LDLDIRECT" in the last 72 hours. Thyroid function studies No results for input(s): "TSH", "T4TOTAL", "T3FREE", "THYROIDAB" in the last 72 hours.  Invalid input(s): "FREET3" Anemia work up No results for input(s): "VITAMINB12", "FOLATE", "FERRITIN", "TIBC", "IRON", "RETICCTPCT" in the last 72 hours. Urinalysis    Component Value Date/Time   COLORURINE YELLOW 06/11/2021 2249   APPEARANCEUR CLEAR 06/11/2021 2249   LABSPEC 1.020 06/11/2021 2249   PHURINE 7.5 06/11/2021 2249   GLUCOSEU NEGATIVE 06/11/2021 2249   HGBUR NEGATIVE 06/11/2021 2249   BILIRUBINUR NEGATIVE 06/11/2021 2249   KETONESUR NEGATIVE 06/11/2021 2249   PROTEINUR NEGATIVE 06/11/2021 2249   UROBILINOGEN 1.0 08/18/2013 1315   NITRITE NEGATIVE 06/11/2021 2249   LEUKOCYTESUR NEGATIVE 06/11/2021 2249   Sepsis Labs Recent Labs  Lab 06/15/22 1827 06/15/22 1908 06/16/22 1752 06/17/22 0810  WBC 6.1 6.5 3.8* 3.3*    Microbiology Recent Results (from the past 240 hour(s))  SARS CORONAVIRUS 2 (TAT 6-24 HRS) Anterior Nasal Swab     Status: None   Collection Time: 06/15/22  4:58 PM   Specimen: Anterior Nasal Swab  Result Value Ref Range Status   SARS Coronavirus 2 NEGATIVE NEGATIVE Final    Comment: (NOTE) SARS-CoV-2 target nucleic acids are NOT DETECTED.  The SARS-CoV-2 RNA is generally detectable in upper and lower respiratory specimens during the acute phase of infection. Negative results do not preclude  SARS-CoV-2 infection, do not rule out co-infections with other pathogens, and should not be used as the sole basis for treatment or other patient management decisions. Negative results must be combined with clinical observations, patient history, and epidemiological information. The expected result is Negative.  Fact Sheet for Patients: SugarRoll.be  Fact Sheet for Healthcare Providers: https://www.woods-mathews.com/  This test is not yet  approved or cleared by the [place] and  has been authorized for detection and/or diagnosis of SARS-CoV-2 by FDA under an Emergency Use Authorization (EUA). This EUA will remain  in effect (meaning this test can be used) for the duration of the COVID-19 declaration under Se ction 564(b)(1) of the Act, 21 U.S.C. section 360bbb-3(b)(1), unless the authorization is terminated or revoked sooner.  Performed at [facility], [place] [address], [place], [place] [address]   Resp panel by RT-PCR (RSV, Flu A&B, Covid) Anterior Nasal Swab     Status: None   Collection Time: 06/15/22  7:08 PM   Specimen: Anterior Nasal Swab  Result Value Ref Range Status   SARS Coronavirus 2 by RT PCR NEGATIVE NEGATIVE Final    Comment: (NOTE) SARS-CoV-2 target nucleic acids are NOT DETECTED.  The SARS-CoV-2 RNA is generally detectable in upper respiratory specimens during the acute phase of infection. The lowest concentration of SARS-CoV-2 viral copies this assay can detect is 138 copies/mL. A negative result does not preclude SARS-Cov-2 infection and should not be used as the sole basis for treatment or other patient management decisions. A negative result may occur with  improper specimen collection/handling, submission of specimen other than nasopharyngeal swab, presence of viral mutation(s) within the areas targeted by this assay, and inadequate number of viral copies(<138 copies/mL). A negative result must be  combined with clinical observations, patient history, and epidemiological information. The expected result is Negative.  Fact Sheet for Patients:  EntrepreneurPulse.com.au  Fact Sheet for Healthcare Providers:  IncredibleEmployment.be  This test is no t yet approved or cleared by the [place] FDA and  has been authorized for detection and/or diagnosis of SARS-CoV-2 by FDA under an Emergency Use Authorization (EUA). This EUA will remain  in effect (meaning this test can be used) for the duration of the COVID-19 declaration under Section 564(b)(1) of the Act, 21 U.S.C.section 360bbb-3(b)(1), unless the authorization is terminated  or revoked sooner.       Influenza A by PCR NEGATIVE NEGATIVE Final   Influenza B by PCR NEGATIVE NEGATIVE Final    Comment: (NOTE) The Xpert Xpress SARS-CoV-2/FLU/RSV plus assay is intended as an aid in the diagnosis of influenza from Nasopharyngeal swab specimens and should not be used as a sole basis for treatment. Nasal washings and aspirates are unacceptable for Xpert Xpress SARS-CoV-2/FLU/RSV testing.  Fact Sheet for Patients: EntrepreneurPulse.com.au  Fact Sheet for Healthcare Providers: IncredibleEmployment.be  This test is not yet approved or cleared by the [place] FDA and has been authorized for detection and/or diagnosis of SARS-CoV-2 by FDA under an Emergency Use Authorization (EUA). This EUA will remain in effect (meaning this test can be used) for the duration of the COVID-19 declaration under Section 564(b)(1) of the Act, 21 U.S.C. section 360bbb-3(b)(1), unless the authorization is terminated or revoked.     Resp Syncytial Virus by PCR NEGATIVE NEGATIVE Final    Comment: (NOTE) Fact Sheet for Patients: EntrepreneurPulse.com.au  Fact Sheet for Healthcare Providers: IncredibleEmployment.be  This test is not yet  approved or cleared by the [place] FDA and has been authorized for detection and/or diagnosis of SARS-CoV-2 by FDA under an Emergency Use Authorization (EUA). This EUA will remain in effect (meaning this test can be used) for the duration of the COVID-19 declaration under Section 564(b)(1) of the Act, 21 U.S.C. section 360bbb-3(b)(1), unless the authorization is terminated or revoked.  Performed at [facility], [address] [place],  [place] [address]   Blood culture (routine x 2)     Status: None (Preliminary result)   Collection Time: 06/16/22 12:01 AM   Specimen: BLOOD  Result Value Ref Range Status   Specimen Description   Final    BLOOD LEFT ANTECUBITAL Performed at [facility], [address] [place], [place] [address]    Special Requests   Final    BOTTLES DRAWN AEROBIC AND ANAEROBIC Blood Culture adequate volume Performed at [facility], [address] [place], [place] [address]    Culture   Final    NO GROWTH 3 DAYS Performed at [facility], [place] [address] [place], [place] [address]    Report Status PENDING  Incomplete  Blood culture (routine x 2)     Status: None (Preliminary result)   Collection Time: 06/16/22  6:04 PM   Specimen: BLOOD LEFT ARM  Result Value Ref Range Status   Specimen Description BLOOD LEFT ARM  Final   Special Requests   Final    BOTTLES DRAWN AEROBIC AND ANAEROBIC Blood Culture adequate volume   Culture   Final    NO GROWTH 3 DAYS Performed at [facility], [place] [address] [place], [place] [address]    Report Status PENDING  Incomplete  CSF culture w Gram Stain     Status: None (Preliminary result)   Collection Time: 06/17/22 10:51 AM   Specimen: PATH Cytology CSF; Cerebrospinal Fluid  Result Value Ref Range Status   Specimen Description CSF  Final   Special Requests NONE  Final   Gram Stain NO WBC SEEN NO ORGANISMS SEEN CYTOSPIN SMEAR   Final   Culture   Final    NO GROWTH 2  DAYS Performed at [facility], [place] [address], [place], [place] [address]    Report Status PENDING  Incomplete     SIGNED:   Charlynne Cousins, MD  [facility] 06/19/2022, 9:54 AM Pager   If 7PM-7AM, please contact night-coverage [email or web address] Password [email or web address]

## 2022-06-20 LAB — CSF CULTURE W GRAM STAIN
Culture: NO GROWTH
Gram Stain: NONE SEEN

## 2022-06-20 LAB — VDRL, CSF: VDRL Quant, CSF: NONREACTIVE

## 2022-06-21 LAB — CULTURE, BLOOD (ROUTINE X 2)
Culture: NO GROWTH
Culture: NO GROWTH
Special Requests: ADEQUATE
Special Requests: ADEQUATE

## 2022-06-22 LAB — VITAMIN A: Vitamin A (Retinoic Acid): 27.3 ug/dL (ref 18.9–57.3)

## 2022-08-24 ENCOUNTER — Ambulatory Visit: Payer: BC Managed Care – PPO | Admitting: Neurology

## 2022-08-24 ENCOUNTER — Encounter: Payer: Self-pay | Admitting: Neurology

## 2022-09-27 ENCOUNTER — Encounter (HOSPITAL_COMMUNITY): Payer: Self-pay | Admitting: Emergency Medicine

## 2022-09-27 ENCOUNTER — Ambulatory Visit (HOSPITAL_COMMUNITY)
Admission: EM | Admit: 2022-09-27 | Discharge: 2022-09-27 | Disposition: A | Discharge status: Home / Self Care | Payer: 59 | Attending: Internal Medicine | Admitting: Internal Medicine

## 2022-09-27 ENCOUNTER — Other Ambulatory Visit: Payer: Self-pay

## 2022-09-27 DIAGNOSIS — R519 Headache, unspecified: Secondary | ICD-10-CM | POA: Diagnosis not present

## 2022-09-27 MED ORDER — KETOROLAC TROMETHAMINE 30 MG/ML IJ SOLN
30.0000 mg | Freq: Once | INTRAMUSCULAR | Status: AC
  Administered 2022-09-27: 30 mg via INTRAMUSCULAR

## 2022-09-27 MED ORDER — DEXAMETHASONE SODIUM PHOSPHATE 10 MG/ML IJ SOLN
10.0000 mg | Freq: Once | INTRAMUSCULAR | Status: AC
  Administered 2022-09-27: 10 mg via INTRAMUSCULAR

## 2022-09-27 MED ORDER — METOCLOPRAMIDE HCL 5 MG/ML IJ SOLN
INTRAMUSCULAR | Status: AC
  Filled 2022-09-27: qty 2

## 2022-09-27 MED ORDER — KETOROLAC TROMETHAMINE 30 MG/ML IJ SOLN
INTRAMUSCULAR | Status: AC
  Filled 2022-09-27: qty 1

## 2022-09-27 MED ORDER — METOCLOPRAMIDE HCL 5 MG/ML IJ SOLN
5.0000 mg | Freq: Once | INTRAMUSCULAR | Status: AC
  Administered 2022-09-27: 5 mg via INTRAMUSCULAR

## 2022-09-27 MED ORDER — DEXAMETHASONE SODIUM PHOSPHATE 10 MG/ML IJ SOLN
INTRAMUSCULAR | Status: AC
  Filled 2022-09-27: qty 1

## 2022-09-27 NOTE — ED Triage Notes (Signed)
Headache since Sunday.  Patient has a history of headaches.  Patient reports neurology appointment for headaches had to be cancelled due to a death in family.  Plans to reschedule with neurology.    Patient has taken ibuprofen last night.  No medications today    Pain in behind eyes and neck is tense.   Patient drove here today.

## 2022-09-27 NOTE — ED Provider Notes (Signed)
[facility]    CSN: [id number] Arrival date & time: 09/27/22  1231      History   Chief Complaint Chief Complaint  Patient presents with   Headache    HPI Marilyn Ramirez is a 27 y.o. female.   Patient presents to urgent care for evaluation of headache that started on Sunday, September 25, 2022 (2 days ago).  Headache is described as throbbing and aching.  Pain is currently a 9.8 on a scale of 0-10 and to the bilateral temples and crown of the head. Reports associated photophobia and phonophobia. History of chronic headaches, however has never been formally diagnosed with migraine headaches. Denies aura symptoms prior to headache onset. She was admitted to the hospital in February 2024 for viral meningitis and headache. She was discharged and advised to follow-up with neurology but was unable to make it to her appointment last month due to a death in the family. She has a neurology appointment scheduled for August 2024.  Denies abdominal pain, neck pain, blurry vision, decreased visual acuity, recent head injuries/trauma, dizziness, viral URI symptoms, fever/chills, or rash.  No recent antibiotic/steroid use. She has been using ibuprofen over the counter without relief. She has not had any medications for headache today. LMP 09/19/22, she is confident that she is not pregnant.    Headache   Past Medical History:  Diagnosis Date   Osteoarthritis of knee    Ovarian cyst    Scoliosis     Patient Active Problem List   Diagnosis Date Noted   Headache 06/16/2022   Tooth infection 06/16/2022   Low back pain 03/14/2011   HYPERTENSION 09/17/2007   SKIN RASH 09/17/2007   PROTEINURIA 09/17/2007   DERMATITIS, ATOPIC 09/20/2006    History reviewed. No pertinent surgical history.  OB History   No obstetric history on file.      Home Medications    Prior to Admission medications   Medication Sig Start Date End Date Taking? Authorizing Provider  acetaZOLAMIDE (DIAMOX) 250 MG  tablet Take 3 tablets (750 mg total) by mouth 2 (two) times daily. Patient not taking: Reported on 09/27/2022 06/19/22   Marinda Elk, MD  ibuprofen (ADVIL) 400 MG tablet Take 1 tablet (400 mg total) by mouth every 6 (six) hours as needed for mild pain (or Fever >/= 101). 06/18/22   Marinda Elk, MD  ondansetron (ZOFRAN-ODT) 4 MG disintegrating tablet Take 1 tablet (4 mg total) by mouth every 8 (eight) hours as needed for nausea or vomiting. Patient not taking: Reported on 09/27/2022 06/18/22   Marinda Elk, MD  polyethylene glycol (MIRALAX / GLYCOLAX) 17 g packet Take 17 g by mouth 2 (two) times daily. 06/19/22   Marinda Elk, MD    Family History Family History  Problem Relation Age of Onset   Hypertension Mother     Social History Social History   Tobacco Use   Smoking status: Former   Smokeless tobacco: Never  Building services engineer Use: Never used  Substance Use Topics   Alcohol use: Yes    Comment: wine   Drug use: Not Currently    Types: Methaqualone     Allergies   Patient has no known allergies.   Review of Systems Review of Systems  Neurological:  Positive for headaches.  Per HPI   Physical Exam Triage Vital Signs ED Triage Vitals  Enc Vitals Group     BP 09/27/22 1315 113/80     Pulse  Rate 09/27/22 1315 80     Resp 09/27/22 1315 18     Temp 09/27/22 1315 98.4 F (36.9 C)     Temp Source 09/27/22 1315 Oral     SpO2 09/27/22 1315 98 %     Weight --      Height --      Head Circumference --      Peak Flow --      Pain Score 09/27/22 1312 10     Pain Loc --      Pain Edu? --      Excl. in GC? --    No data found.  Updated Vital Signs BP 113/80 (BP Location: Left Arm)   Pulse 80   Temp 98.4 F (36.9 C) (Oral)   Resp 18   LMP 09/19/2022   SpO2 98%   Visual Acuity Right Eye Distance:   Left Eye Distance:   Bilateral Distance:    Right Eye Near:   Left Eye Near:    Bilateral Near:     Physical Exam Vitals and  nursing note reviewed.  Constitutional:      Appearance: She is ill-appearing. She is not toxic-appearing.  HENT:     Head: Normocephalic and atraumatic.     Right Ear: Hearing, tympanic membrane, ear canal and external ear normal.     Left Ear: Hearing, tympanic membrane, ear canal and external ear normal.     Nose: Nose normal.     Mouth/Throat:     Lips: Pink.     Mouth: Mucous membranes are moist. No injury.     Tongue: No lesions. Tongue does not deviate from midline.     Palate: No mass and lesions.     Pharynx: Oropharynx is clear. Uvula midline. No pharyngeal swelling, oropharyngeal exudate, posterior oropharyngeal erythema or uvula swelling.     Tonsils: No tonsillar exudate or tonsillar abscesses.  Eyes:     General: Lids are normal. Vision grossly intact. Gaze aligned appropriately.     Extraocular Movements: Extraocular movements intact.     Conjunctiva/sclera: Conjunctivae normal.     Pupils: Pupils are equal, round, and reactive to light.  Neck:     Meningeal: Brudzinski's sign and Kernig's sign absent.  Cardiovascular:     Rate and Rhythm: Normal rate and regular rhythm.     Heart sounds: Normal heart sounds, S1 normal and S2 normal.  Pulmonary:     Effort: Pulmonary effort is normal. No respiratory distress.     Breath sounds: Normal breath sounds and air entry. No wheezing, rhonchi or rales.  Chest:     Chest wall: No tenderness.  Abdominal:     Palpations: Abdomen is soft.  Musculoskeletal:     Cervical back: Normal range of motion and neck supple. No rigidity.  Skin:    General: Skin is warm and dry.     Capillary Refill: Capillary refill takes less than 2 seconds.     Findings: No rash.  Neurological:     General: No focal deficit present.     Mental Status: She is alert and oriented to person, place, and time. Mental status is at baseline.     Cranial Nerves: Cranial nerves 2-12 are intact. No dysarthria or facial asymmetry.     Sensory: Sensation is  intact.     Motor: Motor function is intact.     Coordination: Coordination is intact.     Gait: Gait is intact.     Comments: Strength and  sensation intact to bilateral upper and lower extremities (5/5). Moves all 4 extremities with normal coordination voluntarily. Non-focal neuro exam.   Psychiatric:        Mood and Affect: Mood normal.        Speech: Speech normal.        Behavior: Behavior normal.        Thought Content: Thought content normal.        Judgment: Judgment normal.      UC Treatments / Results  Labs (all labs ordered are listed, but only abnormal results are displayed) Labs Reviewed - No data to display  EKG   Radiology No results found.  Procedures Procedures (including critical care time)  Medications Ordered in UC Medications  ketorolac (TORADOL) 30 MG/ML injection 30 mg (30 mg Intramuscular Given 09/27/22 1406)  metoCLOPramide (REGLAN) injection 5 mg (5 mg Intramuscular Given 09/27/22 1410)  dexamethasone (DECADRON) injection 10 mg (10 mg Intramuscular Given 09/27/22 1408)    Initial Impression / Assessment and Plan / UC Course  I have reviewed the triage vital signs and the nursing notes.  Pertinent labs & imaging results that were available during my care of the patient were reviewed by me and considered in my medical decision making (see chart for details).   1. Bad headache No concerning symptoms, no neurological deficits upon exam and no signs of meningitis. Headache cocktail given in clinic (ketorolac 30mg  IM, dexamethasone 10mg  IM, and reglan 5mg  IM). Patient placed in cool dark room and reassessed 15-20 minutes after headache cocktail. Headache reduced from 10/10 to 6-7/10 20 minutes after medication administration. No NSAID until tomorrow. Advised to rest the rest of the day and eat bland foods to avoid stomach upset. Starting tomorrow, ibuprofen 600mg  every 6 hours as needed for aches/pains. She has a neurology appointment in August 2024, encouraged  to go to this for follow-up. Strict ER return precautions discussed.   Discussed physical exam and available lab work findings in clinic with patient.  Counseled patient regarding appropriate use of medications and potential side effects for all medications recommended or prescribed today. Discussed red flag signs and symptoms of worsening condition,when to call the PCP office, return to urgent care, and when to seek higher level of care in the emergency department. Patient verbalizes understanding and agreement with plan. All questions answered. Patient discharged in stable condition.    Final Clinical Impressions(s) / UC Diagnoses   Final diagnoses:  Bad headache     Discharge Instructions      You have been evaluated today for headache.  You were given medicines for your headache in the clinic today which included a strong NSAID, so do not  take ibuprofen or other NSAIDS (Aleve, aspirin, naproxen, ibuprofen, goody powder, etc.) for the next 12 hours.  Starting tomorrow, take 600mg  ibuprofen every 6 hours or tylenol 1,000 every 6 hours as needed for pain.  Avoid areas of loud noise/harsh light and remember to drink plenty of water to stay well hydrated.  Please follow up with your primary care provider for further management of your headaches.  Please seek emergency medical care if you experience worsening or uncontrolled pain, vision changes, recurrent vomiting, difficulty with normal activities, abnormal behavior, difficulty walking, numbness, weakness, or any other concerning symptoms.   I hope you feel better!     ED Prescriptions   None    PDMP not reviewed this encounter.   Carlisle Beers, [place] 09/27/22 1437

## 2022-09-27 NOTE — Discharge Instructions (Signed)
You have been evaluated today for headache.  You were given medicines for your headache in the clinic today which included a strong NSAID, so do not  take ibuprofen or other NSAIDS (Aleve, aspirin, naproxen, ibuprofen, goody powder, etc.) for the next 12 hours.  Starting tomorrow, take 600mg ibuprofen every 6 hours or tylenol 1,000 every 6 hours as needed for pain.  Avoid areas of loud noise/harsh light and remember to drink plenty of water to stay well hydrated.  Please follow up with your primary care provider for further management of your headaches.  Please seek emergency medical care if you experience worsening or uncontrolled pain, vision changes, recurrent vomiting, difficulty with normal activities, abnormal behavior, difficulty walking, numbness, weakness, or any other concerning symptoms.   I hope you feel better!   

## 2022-11-30 ENCOUNTER — Encounter: Payer: Self-pay | Admitting: Psychiatry

## 2022-11-30 ENCOUNTER — Ambulatory Visit (INDEPENDENT_AMBULATORY_CARE_PROVIDER_SITE_OTHER): Payer: 59 | Admitting: Psychiatry

## 2022-11-30 VITALS — BP 113/78 | HR 81 | Ht 62.0 in | Wt 124.4 lb

## 2022-11-30 DIAGNOSIS — G932 Benign intracranial hypertension: Secondary | ICD-10-CM | POA: Diagnosis not present

## 2022-11-30 DIAGNOSIS — G43009 Migraine without aura, not intractable, without status migrainosus: Secondary | ICD-10-CM

## 2022-11-30 MED ORDER — TOPIRAMATE 50 MG PO TABS: ORAL_TABLET | ORAL | 6 refills | Status: DC

## 2022-11-30 MED ORDER — RIZATRIPTAN BENZOATE 10 MG PO TABS: 10.0000 mg | ORAL_TABLET | ORAL | 0 refills | Status: DC | PRN

## 2022-11-30 NOTE — Patient Instructions (Signed)
What is idiopathic intracranial hypertension? Idiopathic intracranial hypertension is a condition that causes pressure inside the skull. It is also called "pseudotumor cerebri." High pressure around the brain causes headaches and vision loss. What causes idiopathic intracranial hypertension? Doctors do not know. But idiopathic intracranial hypertension is more common in females and people who have obesity. Certain medicines seem to make some people more likely to get idiopathic intracranial hypertension. These medicines include tetracycline, high doses of vitamin A, and growth hormone. What are the symptoms of idiopathic intracranial hypertension? The symptoms include: ?Bad headaches - Some people say that the worst pain is right behind their eyes. ?Short periods of vision loss - This can happen in 1 or both eyes. It usually lasts a few seconds, and might happen once in a while or several times a day. ?Dimming of vision ?Trouble seeing things at the edge of your line of sight ?Double vision ?Seeing flashing lights ?Noises inside your head - The noise might sound like rushing water or wind. It often pulses in time with your heartbeat, and can come and go. Doctors call this "tinnitus." In rare cases, people with idiopathic intracranial hypertension lose their vision forever.  Will I need tests? Yes. Tests can include: ?Eye exam - An eye doctor uses special tools to look for swelling at the back of your eye, near the optic nerve. Most people with idiopathic intracranial hypertension have swelling of the optic nerve. The optic nerve connects the eye to the brain. ?Visual field test - This test checks how well you can see things at the edges of your line of sight. The test will be repeated occasionally to check your optic nerves. ?MRI or CT scan - These are imaging tests that take pictures of the inside of your brain. Your doctor can use them to check if a tumor or other problem is causing your  symptoms. ?Lumbar puncture - During this procedure, a doctor puts a needle into your lower back to measure the fluid pressure inside your skull. A lumbar puncture is sometimes called a "spinal tap."  How is idiopathic intracranial hypertension treated? Treatments include: ?Weight loss - If you are overweight, your doctor will recommend healthy ways to lose weight. If you are very overweight and cannot lose weight through changing your diet and exercise habits, your doctor might recommend medicines or weight loss surgery. ?Medicines - Your doctor might prescribe medicines that help lower the amount of spinal fluid your body makes. Spinal fluid is the fluid that surrounds the brain and spinal cord. They might also recommend medicines used to prevent and treat headaches.

## 2022-11-30 NOTE — Progress Notes (Signed)
Referring:  Gordy Councilman, MD [address] [place],  [place] [address]  PCP: Patient, No Pcp Per  Neurology was asked to evaluate Marilyn Ramirez, a 27 year old female for a chief complaint of headaches.  Our recommendations of care will be communicated by shared medical record.    CC:  headaches  History provided from self  HPI:  Medical co-morbidities: scoliosis  The patient presents for evaluation of headaches. She was hospitalized in February of this year for severe headache behind eyes and around her neck. Headache was so severe that it was waking her up from sleep. CTH was unremarkable. LP showed an opening pressure of 35. CSF studies were normal. Headaches improved slightly after the LP (from 10/10 to 7/10 pain). 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  She stopped taking Diamox because she didn'Marilyn think it was helping and it affected her taste and appetite. She continues to have severe headaches 2-3 times per week. Headaches are associated with photophobia, phonophobia, and nausea. She has intermittent blurred vision and hears her heartbeat in her ears.   Headache History: Onset: February 2023 Triggers: eating pork Aura: blurred vision Location: retro-orbital, occipital Associated Symptoms:  Photophobia: yes  Phonophobia: yes  Nausea: yes Worse with activity?: yes Duration of headaches: several hours  Headache days per month: 12 Headache free days per month: 18  Current Treatment: Abortive Tylenol Advil  Preventative none  Prior Therapies                                 Tylenol Advil Diamox 750 mg BID - side effects   LABS: CBC    Component Value Date/Time   WBC 3.3 (L) 06/17/2022 0810   RBC 4.06 06/17/2022 0810   HGB 12.0 06/17/2022 0810   HCT 35.4 (L) 06/17/2022 0810   PLT 233 06/17/2022 0810   MCV 87.2 06/17/2022 0810   MCH 29.6 06/17/2022 0810   MCHC 33.9 06/17/2022 0810   RDW 11.3 (L) 06/17/2022 0810   LYMPHSABS 1.2  06/15/2022 1908   MONOABS 0.4 06/15/2022 1908   EOSABS 0.0 06/15/2022 1908   BASOSABS 0.0 06/15/2022 1908      Latest Ref Rng & Units 06/19/2022    3:49 AM 06/18/2022    4:39 AM 06/17/2022    8:10 AM  CMP  Glucose 70 - 99 mg/dL 97  97  86   BUN 6 - 20 mg/dL 8  6  8    Creatinine 0.44 - 1.00 mg/dL 1.91  4.78  2.95   Sodium 135 - 145 mmol/L 134  135  137   Potassium 3.5 - 5.1 mmol/L 3.7  3.8  3.4   Chloride 98 - 111 mmol/L 109  106  104   CO2 22 - 32 mmol/L 19  19  24    Calcium 8.9 - 10.3 mg/dL 8.5  9.0  8.4      IMAGING:  CTH 06/15/22: unremarkable  Imaging independently reviewed on November 30, 2022   Current Outpatient Medications on File Prior to Visit  Medication Sig Dispense Refill   ibuprofen (ADVIL) 400 MG tablet Take 1 tablet (400 mg total) by mouth every 6 (six) hours as needed for mild pain (or Fever >/= 101). (Patient taking differently: Take 800 mg by mouth every 6 (six) hours as needed for mild pain (or Fever >/= 101).) 30 tablet 0   ondansetron (ZOFRAN-ODT) 4  MG disintegrating tablet Take 1 tablet (4 mg total) by mouth every 8 (eight) hours as needed for nausea or vomiting. (Patient not taking: Reported on 09/27/2022) 20 tablet 0   polyethylene glycol (MIRALAX / GLYCOLAX) 17 g packet Take 17 g by mouth 2 (two) times daily. (Patient not taking: Reported on 11/30/2022) 14 each 0   No current facility-administered medications on file prior to visit.     Allergies: No Known Allergies  Family History: Family History  Problem Relation Age of Onset   Hypertension Mother      Past Medical History: Past Medical History:  Diagnosis Date   Osteoarthritis of knee    Ovarian cyst    Scoliosis     Past Surgical History History reviewed. No pertinent surgical history.  Social History: Social History   Tobacco Use   Smoking status: Never   Smokeless tobacco: Never  Vaping Use   Vaping status: Former  Substance Use Topics   Alcohol use: Yes    Comment: wine   Drug  use: Not Currently    ROS: Negative for fevers, chills. Positive for headaches, blurred vision. All other systems reviewed and negative unless stated otherwise in HPI.   Physical Exam:   Vital Signs: BP 113/78 (BP Location: Left Arm, Patient Position: Sitting, Cuff Size: Normal)   Pulse 81   Ht 5\' 2"  (1.575 m)   Wt 124 lb 6.4 oz (56.4 kg)   BMI 22.75 kg/m  GENERAL: well appearing,in no acute distress,alert SKIN:  Color, texture, turgor normal. No rashes or lesions HEAD:  Normocephalic/atraumatic. CV:  RRR  NEUROLOGICAL: Mental Status: Alert, oriented to person, place and time,Follows commands Cranial Nerves: blurred optic disc margins bilaterally, PERRL, visual fields intact to confrontation, extraocular movements intact, facial sensation intact, no facial droop or ptosis, hearing grossly intact, no dysarthria, palate elevate symmetrically, tongue protrudes midline, shoulder shrug intact and symmetric Motor: muscle strength 5/5 both upper and lower extremities,no drift, normal tone Reflexes: 2+ throughout Sensation: intact to light touch all 4 extremities Coordination: Finger-to- nose-finger intact bilaterally Gait: normal-based   IMPRESSION: 27 year old female with a history of scoliosis who presents for evaluation of IIH (opening pressure 35). She stopped taking Diamox because she find it helpful and it affected her taste. Will start Topamax for IIH and headaches. Maxalt prescribed for rescue of her migrainous headaches. Referral to Ophthalmology placed for monitoring of the optic nerves.  PLAN: -Start Topamax 50 mg at bedtime x1 week then increase to 50 mg BID. Uptitrate as needed -Start Maxalt 10 mg PRN for migraine headaches -Referral to Ophthalmology   I spent a total of 31 minutes chart reviewing and counseling the patient. Headache education was done. Discussed treatment options including preventive and acute medications.  Discussed medication side effects, adverse  reactions and drug interactions. Written educational materials and patient instructions outlining all of the above were given.  Follow-up: 3 months   Ocie Doyne, MD 11/30/2022   2:27 PM

## 2022-12-05 ENCOUNTER — Telehealth: Payer: Self-pay | Admitting: Psychiatry

## 2022-12-05 NOTE — Telephone Encounter (Signed)
Referral for ophthalmology fax to [facility]. Phone: [phone], Fax: [phone]. 

## 2023-02-09 ENCOUNTER — Ambulatory Visit (HOSPITAL_COMMUNITY)
Admission: EM | Admit: 2023-02-09 | Discharge: 2023-02-09 | Disposition: A | Discharge status: Home / Self Care | Payer: 59 | Attending: Internal Medicine | Admitting: Internal Medicine

## 2023-02-09 ENCOUNTER — Other Ambulatory Visit: Payer: Self-pay

## 2023-02-09 ENCOUNTER — Encounter (HOSPITAL_COMMUNITY): Payer: Self-pay | Admitting: *Deleted

## 2023-02-09 DIAGNOSIS — R051 Acute cough: Secondary | ICD-10-CM | POA: Diagnosis not present

## 2023-02-09 DIAGNOSIS — J0110 Acute frontal sinusitis, unspecified: Secondary | ICD-10-CM

## 2023-02-09 MED ORDER — AMOXICILLIN-POT CLAVULANATE 875-125 MG PO TABS: 1.0000 | ORAL_TABLET | Freq: Two times a day (BID) | ORAL | 0 refills | Status: DC

## 2023-02-09 MED ORDER — BENZONATATE 100 MG PO CAPS: 100.0000 mg | ORAL_CAPSULE | Freq: Three times a day (TID) | ORAL | 0 refills | Status: DC

## 2023-02-09 NOTE — ED Triage Notes (Signed)
Pt reports she has had a cough and HA for one week.

## 2023-02-09 NOTE — Discharge Instructions (Signed)
Your evaluation shows you have a bacterial sinus infection. - Take antibiotic sent to pharmacy as directed to treat sinus infection. - Purchase Mucinex over the counter and take this every 12 hours as needed for nasal congestion and cough. - Cough medicines as needed. - Warm compresses to the cheeks and forehead as needed to help with sinus headaches as well as tylenol as needed.  If you develop any new or worsening symptoms or if your symptoms do not start to improve, please return here or follow-up with your primary care provider. If your symptoms are severe, please go to the emergency room.

## 2023-02-09 NOTE — ED Provider Notes (Signed)
[facility]    CSN: [id number] Arrival date & time: 02/09/23  4742      History   Chief Complaint Chief Complaint  Patient presents with   Cough   Headache    HPI Marilyn Ramirez is a 27 y.o. female.   Patient presents to urgent care for evaluation of cough, nasal congestion, sore throat, generalized fatigue, and frontal headache that started 8 days ago.  Cough is sometimes productive but mostly dry.  Nasal congestion is significant and yellow/green.  She is experiencing frontal headache bilaterally that is currently a 7 on a scale of 0-10.  Denies nausea, vomiting, diarrhea, abdominal pain, shortness of breath, chest pain, heart palpitations, rash, and fever/chills.  No history of asthma or chronic respiratory problems.  Denies drug use and smoking.  She is using over-the-counter medications without relief of symptoms.  Works in daycare with multiple sick exposures.   Cough Associated symptoms: headaches   Headache Associated symptoms: cough     Past Medical History:  Diagnosis Date   Osteoarthritis of knee    Ovarian cyst    Scoliosis     Patient Active Problem List   Diagnosis Date Noted   Headache 06/16/2022   Tooth infection 06/16/2022   Low back pain 03/14/2011   Essential hypertension 09/17/2007   SKIN RASH 09/17/2007   PROTEINURIA 09/17/2007   DERMATITIS, ATOPIC 09/20/2006    History reviewed. No pertinent surgical history.  OB History   No obstetric history on file.      Home Medications    Prior to Admission medications   Medication Sig Start Date End Date Taking? Authorizing Provider  amoxicillin-clavulanate (AUGMENTIN) 875-125 MG tablet Take 1 tablet by mouth every 12 (twelve) hours. 02/09/23  Yes Carlisle Beers, FNP  benzonatate (TESSALON) 100 MG capsule Take 1 capsule (100 mg total) by mouth every 8 (eight) hours. 02/09/23  Yes Carlisle Beers, FNP  ibuprofen (ADVIL) 400 MG tablet Take 1 tablet (400 mg total) by mouth  every 6 (six) hours as needed for mild pain (or Fever >/= 101). Patient taking differently: Take 800 mg by mouth every 6 (six) hours as needed for mild pain (pain score 1-3) (or Fever >/= 101). 06/18/22   Marinda Elk, MD  ondansetron (ZOFRAN-ODT) 4 MG disintegrating tablet Take 1 tablet (4 mg total) by mouth every 8 (eight) hours as needed for nausea or vomiting. Patient not taking: Reported on 09/27/2022 06/18/22   Marinda Elk, MD  polyethylene glycol (MIRALAX / GLYCOLAX) 17 g packet Take 17 g by mouth 2 (two) times daily. Patient not taking: Reported on 11/30/2022 06/19/22   Marinda Elk, MD  rizatriptan (MAXALT) 10 MG tablet Take 1 tablet (10 mg total) by mouth as needed for migraine. May repeat in 2 hours if needed 11/30/22   Ocie Doyne, MD  topiramate (TOPAMAX) 50 MG tablet Take 1 pill at bedtime for one week, then increase to 1 pill twice a day 11/30/22   Ocie Doyne, MD    Family History Family History  Problem Relation Age of Onset   Hypertension Mother     Social History Social History   Tobacco Use   Smoking status: Never   Smokeless tobacco: Never  Vaping Use   Vaping status: Former  Substance Use Topics   Alcohol use: Yes    Comment: wine   Drug use: Not Currently     Allergies   Patient has no known allergies.   Review of  Systems Review of Systems  Respiratory:  Positive for cough.   Neurological:  Positive for headaches.  Per HPI   Physical Exam Triage Vital Signs ED Triage Vitals  Encounter Vitals Group     BP 02/09/23 0943 105/68     Systolic BP Percentile --      Diastolic BP Percentile --      Pulse Rate 02/09/23 0943 80     Resp 02/09/23 0943 16     Temp 02/09/23 0943 98.7 F (37.1 C)     Temp src --      SpO2 02/09/23 0943 98 %     Weight --      Height --      Head Circumference --      Peak Flow --      Pain Score 02/09/23 0939 9     Pain Loc --      Pain Education --      Exclude from Growth Chart --    No  data found.  Updated Vital Signs BP 105/68   Pulse 80   Temp 98.7 F (37.1 C)   Resp 16   LMP 01/18/2023   SpO2 98%   Visual Acuity Right Eye Distance:   Left Eye Distance:   Bilateral Distance:    Right Eye Near:   Left Eye Near:    Bilateral Near:     Physical Exam Vitals and nursing note reviewed.  Constitutional:      Appearance: She is not ill-appearing or toxic-appearing.  HENT:     Head: Normocephalic and atraumatic.     Right Ear: Hearing, tympanic membrane, ear canal and external ear normal.     Left Ear: Hearing, tympanic membrane, ear canal and external ear normal.     Nose: Congestion and rhinorrhea present.     Right Sinus: Frontal sinus tenderness present.     Left Sinus: Frontal sinus tenderness present.     Mouth/Throat:     Lips: Pink.     Mouth: Mucous membranes are moist. No injury.     Tongue: No lesions. Tongue does not deviate from midline.     Palate: No mass and lesions.     Pharynx: Oropharynx is clear. Uvula midline. Posterior oropharyngeal erythema present. No pharyngeal swelling, oropharyngeal exudate or uvula swelling.     Tonsils: No tonsillar exudate or tonsillar abscesses.  Eyes:     General: Lids are normal. Vision grossly intact. Gaze aligned appropriately.     Extraocular Movements: Extraocular movements intact.     Conjunctiva/sclera: Conjunctivae normal.  Cardiovascular:     Rate and Rhythm: Normal rate and regular rhythm.     Heart sounds: Normal heart sounds, S1 normal and S2 normal.  Pulmonary:     Effort: Pulmonary effort is normal. No respiratory distress.     Breath sounds: Normal breath sounds and air entry. No wheezing, rhonchi or rales.  Chest:     Chest wall: No tenderness.  Musculoskeletal:     Cervical back: Neck supple.  Lymphadenopathy:     Cervical: Cervical adenopathy present.  Skin:    General: Skin is warm and dry.     Capillary Refill: Capillary refill takes less than 2 seconds.     Findings: No rash.   Neurological:     General: No focal deficit present.     Mental Status: She is alert and oriented to person, place, and time. Mental status is at baseline.     Cranial Nerves: No  dysarthria or facial asymmetry.  Psychiatric:        Mood and Affect: Mood normal.        Speech: Speech normal.        Behavior: Behavior normal.        Thought Content: Thought content normal.        Judgment: Judgment normal.      UC Treatments / Results  Labs (all labs ordered are listed, but only abnormal results are displayed) Labs Reviewed - No data to display  EKG   Radiology No results found.  Procedures Procedures (including critical care time)  Medications Ordered in UC Medications - No data to display  Initial Impression / Assessment and Plan / UC Course  I have reviewed the triage vital signs and the nursing notes.  Pertinent labs & imaging results that were available during my care of the patient were reviewed by me and considered in my medical decision making (see chart for details).   1.  Acute nonrecurrent frontal sinusitis, acute cough Presentation is consistent with acute postviral bacterial sinusitis.   Symptoms have been present for greater than 8 days and have not responded well to over-the-counter therapies, therefore may have antibiotic.  Prescriptions for further symptomatic relief sent, may continue using OTC medications as needed. Deferred imaging of the chest based on stable cardiopulmonary exam and hemodynamically stable vital signs. Recommend warm compresses to the sinuses as needed.   Counseled patient on potential for adverse effects with medications prescribed/recommended today, strict ER and return-to-clinic precautions discussed, patient verbalized understanding.    Final Clinical Impressions(s) / UC Diagnoses   Final diagnoses:  Acute cough  Acute non-recurrent frontal sinusitis     Discharge Instructions      Your evaluation shows you have a  bacterial sinus infection. - Take antibiotic sent to pharmacy as directed to treat sinus infection. - Purchase Mucinex over the counter and take this every 12 hours as needed for nasal congestion and cough. - Cough medicines as needed. - Warm compresses to the cheeks and forehead as needed to help with sinus headaches as well as tylenol as needed.  If you develop any new or worsening symptoms or if your symptoms do not start to improve, please return here or follow-up with your primary care provider. If your symptoms are severe, please go to the emergency room.      ED Prescriptions     Medication Sig Dispense Auth. Provider   amoxicillin-clavulanate (AUGMENTIN) 875-125 MG tablet Take 1 tablet by mouth every 12 (twelve) hours. 14 tablet Reita May M, FNP   benzonatate (TESSALON) 100 MG capsule Take 1 capsule (100 mg total) by mouth every 8 (eight) hours. 21 capsule Carlisle Beers, FNP      PDMP not reviewed this encounter.   Carlisle Beers, [place] 02/09/23 1130

## 2023-02-13 NOTE — Plan of Care (Signed)
[facility] Tonsillectomy/Adenoidectomy, Postoperative PEDS care plan entered in error.

## 2023-03-30 ENCOUNTER — Ambulatory Visit (HOSPITAL_COMMUNITY)
Admission: EM | Admit: 2023-03-30 | Discharge: 2023-03-30 | Disposition: A | Discharge status: Home / Self Care | Payer: 59 | Attending: Family Medicine | Admitting: Family Medicine

## 2023-03-30 ENCOUNTER — Encounter (HOSPITAL_COMMUNITY): Payer: Self-pay

## 2023-03-30 DIAGNOSIS — H579 Unspecified disorder of eye and adnexa: Secondary | ICD-10-CM

## 2023-03-30 MED ORDER — OLOPATADINE HCL 0.2 % OP SOLN: 1.0000 [drp] | Freq: Every day | OPHTHALMIC | 0 refills | Status: DC

## 2023-03-30 NOTE — ED Triage Notes (Signed)
Pt reports eye pain and itching x 2 days. Denies any recent trauma.

## 2023-03-30 NOTE — ED Provider Notes (Signed)
  [facility]   [id number] 03/30/23 Arrival Time: 0913  ASSESSMENT & PLAN:  1. Itchy eyes    Ques early viral pinkeye vs allergy. Discussed.  Local eye care.  New Prescriptions   OLOPATADINE HCL 0.2 % SOLN    Apply 1 drop to eye daily.   Work note provided.   Follow-up Information     Marilyn Ramirez.   Specialty: Urgent Care Why: If worsening or failing to improve as anticipated. Contact information: [address] [place] [address] [phone]                Reviewed expectations re: course of current medical issues. Questions answered. Outlined signs and symptoms indicating need for more acute intervention. Understanding verbalized. After Visit Summary given.   SUBJECTIVE: History from: Patient. [name] is a 27 y.o. female. Reports eye itching x 2 days. Denies any recent trauma. Does not wear contacts. Works at school. Ques pinkeye. No tx PTA.  OBJECTIVE:  Vitals:   03/30/23 1037  BP: 112/66  Pulse: 74  Resp: 18  Temp: 98.5 F (36.9 C)  TempSrc: Oral  SpO2: 97%    General appearance: alert; no distress Eyes: PERRLA; EOMI; conjunctivae with just the slightest irritation bilaterally; slight watery drainage HENT: [place]; AT; without nasal congestion Neck: supple  Lungs: speaks full sentences without difficulty; unlabored Extremities: no edema Skin: warm and dry Neurologic: normal gait Psychological: alert and cooperative; normal mood and affect   No Known Allergies  Past Medical History:  Diagnosis Date   Osteoarthritis of knee    Ovarian cyst    Scoliosis    Social History   Socioeconomic History   Marital status: Single    Spouse name: Not on file   Number of children: 0   Years of education: Not on file   Highest education level: High school graduate  Occupational History   Not on file  Tobacco Use   Smoking status: Never   Smokeless tobacco: Never  Vaping Use   Vaping  status: Former  Substance and Sexual Activity   Alcohol use: Yes    Comment: wine   Drug use: Not Currently   Sexual activity: Not Currently    Birth control/protection: None  Other Topics Concern   Not on file  Social History Narrative   Right handed   No coffee or soda    Social Determinants of Health   Financial Resource Strain: Not on file  Food Insecurity: No Food Insecurity (06/17/2022)   Hunger Vital Sign    Worried About Running Out of Food in the Last Year: Never true    Ran Out of Food in the Last Year: Never true  Transportation Needs: No Transportation Needs (06/17/2022)   PRAPARE - Administrator, Civil Service (Medical): No    Lack of Transportation (Non-Medical): No  Physical Activity: Not on file  Stress: Not on file  Social Connections: Not on file  Intimate Partner Violence: Not At Risk (06/17/2022)   Humiliation, Afraid, Rape, and Kick questionnaire    Fear of Current or Ex-Partner: No    Emotionally Abused: No    Physically Abused: No    Sexually Abused: No   Family History  Problem Relation Age of Onset   Hypertension Mother    History reviewed. No pertinent surgical history.   Mardella Layman, MD 03/30/23 1056

## 2023-04-03 ENCOUNTER — Ambulatory Visit (INDEPENDENT_AMBULATORY_CARE_PROVIDER_SITE_OTHER): Payer: 59 | Admitting: Adult Health

## 2023-04-03 ENCOUNTER — Encounter: Payer: Self-pay | Admitting: Adult Health

## 2023-04-03 VITALS — BP 102/66 | HR 75 | Ht 62.0 in | Wt 124.0 lb

## 2023-04-03 DIAGNOSIS — G932 Benign intracranial hypertension: Secondary | ICD-10-CM | POA: Diagnosis not present

## 2023-04-03 MED ORDER — TOPIRAMATE 50 MG PO TABS: 50.0000 mg | ORAL_TABLET | Freq: Two times a day (BID) | ORAL | 5 refills | Status: DC

## 2023-04-03 NOTE — Patient Instructions (Addendum)
Your Plan:  Continue topamax but increase to 50mg  twice daily - if any difficulty tolerating, please let me know and you can take both tablets at nighttime instead   You will be called to schedule a eye exam with Dr. Dione Booze - if you do not hear from them in the next 1-2 weeks, please let me know     Follow up in 6 months or call earlier if needed      Thank you for coming to see [place] at [facility]. I hope we have been able to provide you high quality care today.  You may receive a patient satisfaction survey over the next few weeks. We would appreciate your feedback and comments so that we may continue to improve ourselves and the health of our patients.

## 2023-04-03 NOTE — Progress Notes (Signed)
Referring:  No referring provider defined for this encounter.  PCP: Patient, No Pcp Per    CC:  headaches  History provided from self  Follow-up visit:  Prior visit: 11/30/2022 with Dr. Delena Bali (initial visit)  Brief HPI:   Marilyn Ramirez is a 27 y.o. female with PMH of scoliosis who was evaluated by Dr. 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 Self d/c'd Diamox due to side effects and no noticeable benefit.  At prior visit, she was started on topiramate with gradual titration to 50 mg BID and referral placed ophthalmology to evaluate for papilledema.    Interval history:  Returns today for follow-up visit.  Continues to have occasional headaches behind eyes, about 3x per week.  Currently on topiramate 50 mg nightly, did not increase as previously advised.  Denies any benefit with rizatriptan, has been using ibuprofen 800mg  as needed with benefit. Denies over use. Was not evaluated by ophthalmology as previously advised, denies any significant vision changes but feels she needs to squint more at times.     Headache History: Onset: February 2023 Triggers: eating pork Aura: blurred vision Location: retro-orbital, occipital Associated Symptoms:  Photophobia: yes  Phonophobia: yes  Nausea: yes Worse with activity?: yes Duration of headaches: several hours  Headache days per month: 12 Headache free days per month: 18  Current Treatment: Abortive Tylenol Advil Rizatriptan  Preventative Topiramate  Prior Therapies                                 Tylenol Advil Rizatriptan Diamox 750 mg BID - side effects Topiramate 50 mg nightly   LABS: CBC    Component Value Date/Time   WBC 3.3 (L) 06/17/2022 0810   RBC 4.06 06/17/2022 0810   HGB 12.0 06/17/2022 0810   HCT 35.4 (L) 06/17/2022 0810   PLT 233 06/17/2022 0810   MCV 87.2 06/17/2022 0810   MCH 29.6 06/17/2022 0810   MCHC 33.9 06/17/2022 0810   RDW 11.3 (L) 06/17/2022  0810   LYMPHSABS 1.2 06/15/2022 1908   MONOABS 0.4 06/15/2022 1908   EOSABS 0.0 06/15/2022 1908   BASOSABS 0.0 06/15/2022 1908      Latest Ref Rng & Units 06/19/2022    3:49 AM 06/18/2022    4:39 AM 06/17/2022    8:10 AM  CMP  Glucose 70 - 99 mg/dL 97  97  86   BUN 6 - 20 mg/dL 8  6  8    Creatinine 0.44 - 1.00 mg/dL 7.82  9.56  2.13   Sodium 135 - 145 mmol/L 134  135  137   Potassium 3.5 - 5.1 mmol/L 3.7  3.8  3.4   Chloride 98 - 111 mmol/L 109  106  104   CO2 22 - 32 mmol/L 19  19  24    Calcium 8.9 - 10.3 mg/dL 8.5  9.0  8.4      IMAGING:  CTH 06/15/22: unremarkable  Imaging independently reviewed on April 03, 2023   Current Outpatient Medications on File Prior to Visit  Medication Sig Dispense Refill   ibuprofen (ADVIL) 400 MG tablet Take 1 tablet (400 mg total) by mouth every 6 (six) hours as needed for mild pain (or Fever >/= 101). (Patient taking differently: Take 800 mg by mouth as needed for mild pain (pain score 1-3) (or Fever >/= 101).) 30 tablet 0  Multiple Vitamin (MULTIVITAMIN PO) Take by mouth.     rizatriptan (MAXALT) 10 MG tablet Take 10 mg by mouth as needed for migraine. May repeat in 2 hours if needed     benzonatate (TESSALON) 100 MG capsule Take 1 capsule (100 mg total) by mouth every 8 (eight) hours. 21 capsule 0   Olopatadine HCl 0.2 % SOLN Apply 1 drop to eye daily. (Patient not taking: Reported on 04/03/2023) 2.5 mL 0   No current facility-administered medications on file prior to visit.     Allergies: No Known Allergies  Family History: Family History  Problem Relation Age of Onset   Hypertension Mother    Migraines Neg Hx      Past Medical History: Past Medical History:  Diagnosis Date   Osteoarthritis of knee    Ovarian cyst    Scoliosis     Past Surgical History History reviewed. No pertinent surgical history.  Social History: Social History   Tobacco Use   Smoking status: Never   Smokeless tobacco: Never  Vaping Use    Vaping status: Former  Substance Use Topics   Alcohol use: Yes    Comment: wine   Drug use: Not Currently    ROS: Negative for fevers, chills. Positive for headaches, blurred vision. All other systems reviewed and negative unless stated otherwise in HPI.   Physical Exam:   Vital Signs: BP 102/66   Pulse 75   Ht 5\' 2"  (1.575 m)   Wt 124 lb (56.2 kg)   LMP 03/30/2023   BMI 22.68 kg/m  GENERAL: well appearing,in no acute distress,alert SKIN:  Color, texture, turgor normal. No rashes or lesions HEAD:  Normocephalic/atraumatic. CV:  RRR  NEUROLOGICAL: Mental Status: Alert, oriented to person, place and time,Follows commands Cranial Nerves: blurred optic disc margins bilaterally, PERRL, visual fields intact to confrontation, extraocular movements intact, facial sensation intact, no facial droop or ptosis, hearing grossly intact, no dysarthria, palate elevate symmetrically, tongue protrudes midline, shoulder shrug intact and symmetric Motor: muscle strength 5/5 both upper and lower extremities,no drift, normal tone Reflexes: 2+ throughout Sensation: intact to light touch all 4 extremities Coordination: Finger-to- nose-finger intact bilaterally Gait: normal-based     IMPRESSION: 27 year old female with a history of scoliosis who presents for evaluation of IIH (opening pressure 35). She stopped taking Diamox because she find it helpful and it affected her taste.  Continues to have about 12 headaches per month despite use of topiramate 50 mg nightly, limited benefit with rizatriptan, has not yet had follow up with ophthalmology.    PLAN: -Increase Topamax to 50 mg BID. Uptitrate as needed -continue use of ibuprofen as needed, discussed avoidance of overuse -Referral to Ophthalmology again, discussed importance of evaluation   Follow-up in 6 months or call earlier if needed    I spent 30 minutes of face-to-face and non-face-to-face time with patient.  This included previsit  chart review, lab review, study review, order entry, electronic health record documentation, patient education and discussion regarding above diagnoses and treatment plan and answered all other questions to patient's satisfaction  Ihor Austin, [facility]  [facility] [address] [place], [place] [address]  Phone [phone] Fax [phone] Note: This document was prepared with digital dictation and possible smart phrase technology. Any transcriptional errors that result from this process are unintentional.

## 2023-04-10 ENCOUNTER — Telehealth: Payer: Self-pay | Admitting: Adult Health

## 2023-04-10 NOTE — Telephone Encounter (Signed)
Referral for ophthalmology fax to [facility]. Phone: [phone], Fax: [phone]. 

## 2023-05-20 ENCOUNTER — Inpatient Hospital Stay (HOSPITAL_COMMUNITY)
Admission: AD | Admit: 2023-05-20 | Discharge: 2023-05-20 | Disposition: A | Discharge status: Home / Self Care | Payer: 59 | Attending: Obstetrics and Gynecology | Admitting: Obstetrics and Gynecology

## 2023-05-20 ENCOUNTER — Inpatient Hospital Stay (HOSPITAL_COMMUNITY): Payer: 59

## 2023-05-20 ENCOUNTER — Encounter (HOSPITAL_COMMUNITY): Payer: Self-pay | Admitting: Obstetrics and Gynecology

## 2023-05-20 ENCOUNTER — Other Ambulatory Visit: Payer: Self-pay

## 2023-05-20 DIAGNOSIS — O23592 Infection of other part of genital tract in pregnancy, second trimester: Secondary | ICD-10-CM | POA: Insufficient documentation

## 2023-05-20 DIAGNOSIS — O98819 Other maternal infectious and parasitic diseases complicating pregnancy, unspecified trimester: Secondary | ICD-10-CM

## 2023-05-20 DIAGNOSIS — O219 Vomiting of pregnancy, unspecified: Secondary | ICD-10-CM | POA: Insufficient documentation

## 2023-05-20 DIAGNOSIS — O26891 Other specified pregnancy related conditions, first trimester: Secondary | ICD-10-CM | POA: Insufficient documentation

## 2023-05-20 DIAGNOSIS — O98811 Other maternal infectious and parasitic diseases complicating pregnancy, first trimester: Secondary | ICD-10-CM | POA: Insufficient documentation

## 2023-05-20 DIAGNOSIS — B3731 Acute candidiasis of vulva and vagina: Secondary | ICD-10-CM | POA: Insufficient documentation

## 2023-05-20 DIAGNOSIS — Z3491 Encounter for supervision of normal pregnancy, unspecified, first trimester: Secondary | ICD-10-CM

## 2023-05-20 DIAGNOSIS — Z3A01 Less than 8 weeks gestation of pregnancy: Secondary | ICD-10-CM | POA: Insufficient documentation

## 2023-05-20 DIAGNOSIS — R109 Unspecified abdominal pain: Secondary | ICD-10-CM | POA: Diagnosis not present

## 2023-05-20 LAB — URINALYSIS, ROUTINE W REFLEX MICROSCOPIC
Bilirubin Urine: NEGATIVE
Glucose, UA: NEGATIVE mg/dL
Hgb urine dipstick: NEGATIVE
Ketones, ur: 80 mg/dL — AB
Nitrite: NEGATIVE
Protein, ur: 30 mg/dL — AB
Specific Gravity, Urine: 1.031 — ABNORMAL HIGH (ref 1.005–1.030)
pH: 5 (ref 5.0–8.0)

## 2023-05-20 LAB — CBC
HCT: 37.4 % (ref 36.0–46.0)
Hemoglobin: 13.2 g/dL (ref 12.0–15.0)
MCH: 29.8 pg (ref 26.0–34.0)
MCHC: 35.3 g/dL (ref 30.0–36.0)
MCV: 84.4 fL (ref 80.0–100.0)
Platelets: 288 10*3/uL (ref 150–400)
RBC: 4.43 MIL/uL (ref 3.87–5.11)
RDW: 11.1 % — ABNORMAL LOW (ref 11.5–15.5)
WBC: 6.2 10*3/uL (ref 4.0–10.5)
nRBC: 0 % (ref 0.0–0.2)

## 2023-05-20 LAB — WET PREP, GENITAL
Sperm: NONE SEEN
Trich, Wet Prep: NONE SEEN
WBC, Wet Prep HPF POC: >=10 — AB (ref <10)

## 2023-05-20 LAB — ABO/RH: ABO/RH(D): A POS

## 2023-05-20 LAB — HCG, QUANTITATIVE, PREGNANCY: hCG, Beta Chain, Quant, S: 107620 m[IU]/mL — ABNORMAL HIGH (ref <5)

## 2023-05-20 LAB — POCT PREGNANCY, URINE: Preg Test, Ur: POSITIVE — AB

## 2023-05-20 MED ORDER — SCOPOLAMINE 1 MG/3DAYS TD PT72: 1.0000 | MEDICATED_PATCH | TRANSDERMAL | 1 refills | Status: DC

## 2023-05-20 MED ORDER — ONDANSETRON 4 MG PO TBDP
8.0000 mg | ORAL_TABLET | Freq: Once | ORAL | Status: AC
  Administered 2023-05-20: 8 mg via ORAL
  Filled 2023-05-20: qty 2

## 2023-05-20 MED ORDER — PROCHLORPERAZINE EDISYLATE 10 MG/2ML IJ SOLN
10.0000 mg | Freq: Once | INTRAMUSCULAR | Status: AC
  Administered 2023-05-20: 10 mg via INTRAVENOUS
  Filled 2023-05-20: qty 2

## 2023-05-20 MED ORDER — ONDANSETRON 8 MG PO TBDP: 8.0000 mg | ORAL_TABLET | Freq: Three times a day (TID) | ORAL | 2 refills | Status: DC | PRN

## 2023-05-20 MED ORDER — FAMOTIDINE 20 MG PO TABS: 20.0000 mg | ORAL_TABLET | Freq: Two times a day (BID) | ORAL | 2 refills | Status: DC

## 2023-05-20 MED ORDER — LACTATED RINGERS IV BOLUS
1000.0000 mL | Freq: Once | INTRAVENOUS | Status: AC
  Administered 2023-05-20: 1000 mL via INTRAVENOUS

## 2023-05-20 MED ORDER — TERCONAZOLE 0.4 % VA CREA: 1.0000 | TOPICAL_CREAM | Freq: Every day | VAGINAL | 0 refills | Status: DC

## 2023-05-20 MED ORDER — METOCLOPRAMIDE HCL 10 MG PO TABS: 10.0000 mg | ORAL_TABLET | Freq: Four times a day (QID) | ORAL | 2 refills | Status: DC

## 2023-05-20 MED ORDER — PROMETHAZINE HCL 25 MG PO TABS: 25.0000 mg | ORAL_TABLET | Freq: Four times a day (QID) | ORAL | 2 refills | Status: DC | PRN

## 2023-05-20 NOTE — MAU Provider Note (Signed)
History     CSN: [id number]  Arrival date and time: 05/20/23 1314  Chief Complaint  Patient presents with   Emesis   Nausea   HPI  Marilyn Ramirez is a 28 y.o. G1P0 at [redacted]w[redacted]d who presents for evaluation of nausea, vomiting and cramping. Patient reports she has vomited twice today but ongoing for several weeks. Patient rates the pain as a 4/10 and has not tried anything for the pain. She has not tried anything for the nausea and vomiting.  She denies any vaginal bleeding and discharge. Denies any constipation, diarrhea or any urinary complaints.   OB History     Gravida  1   Para      Term      Preterm      AB      Living         SAB      IAB      Ectopic      Multiple      Live Births              Past Medical History:  Diagnosis Date   Osteoarthritis of knee    Ovarian cyst    Scoliosis     No past surgical history on file.  Family History  Problem Relation Age of Onset   Hypertension Mother    Migraines Neg Hx     Social History   Tobacco Use   Smoking status: Never   Smokeless tobacco: Never  Vaping Use   Vaping status: Former  Substance Use Topics   Alcohol use: Yes    Comment: wine   Drug use: Not Currently    Allergies: No Known Allergies  No medications prior to admission.    Review of Systems Physical Exam   Blood pressure 116/68, pulse 84, temperature 98.3 F (36.8 C), temperature source Oral, resp. rate 18, height 5\' 2"  (1.575 m), weight 56.4 kg, last menstrual period 03/29/2023, SpO2 100%.  Patient Vitals for the past 24 hrs:  BP Temp Temp src Pulse Resp SpO2 Height Weight  05/20/23 1623 116/68 98.3 F (36.8 C) Oral 84 18 100 % -- --  05/20/23 1327 (!) 103/57 98.3 F (36.8 C) Oral 72 18 100 % -- --  05/20/23 1323 -- -- -- -- -- -- 5\' 2"  (1.575 m) 56.4 kg    Physical Exam  Fetal Tracing:  Baseline: Variability: Accels: Decels:  Toco:      MAU Course  Procedures  Results for orders placed or  performed during the hospital encounter of 05/20/23 (from the past 24 hours)  Pregnancy, urine POC     Status: Abnormal   Collection Time: 05/20/23  1:32 PM  Result Value Ref Range   Preg Test, Ur POSITIVE (A) NEGATIVE  Urinalysis, Routine w reflex microscopic -Urine, Clean Catch     Status: Abnormal   Collection Time: 05/20/23  1:37 PM  Result Value Ref Range   Color, Urine YELLOW YELLOW   APPearance HAZY (A) CLEAR   Specific Gravity, Urine 1.031 (H) 1.005 - 1.030   pH 5.0 5.0 - 8.0   Glucose, UA NEGATIVE NEGATIVE mg/dL   Hgb urine dipstick NEGATIVE NEGATIVE   Bilirubin Urine NEGATIVE NEGATIVE   Ketones, ur 80 (A) NEGATIVE mg/dL   Protein, ur 30 (A) NEGATIVE mg/dL   Nitrite NEGATIVE NEGATIVE   Leukocytes,Ua SMALL (A) NEGATIVE   RBC / HPF 0-5 0 - 5 RBC/hpf   WBC, UA 0-5 0 - 5 WBC/hpf  Bacteria, UA FEW (A) NONE SEEN   Squamous Epithelial / HPF 6-10 0 - 5 /HPF   Mucus PRESENT   Wet prep, genital     Status: Abnormal   Collection Time: 05/20/23  1:53 PM   Specimen: PATH Cytology Cervicovaginal Ancillary Only  Result Value Ref Range   Yeast Wet Prep HPF POC PRESENT (A) NONE SEEN   Trich, Wet Prep NONE SEEN NONE SEEN   Clue Cells Wet Prep HPF POC PRESENT (A) NONE SEEN   WBC, Wet Prep HPF POC >=10 (A) <10   Sperm NONE SEEN   ABO/Rh     Status: None   Collection Time: 05/20/23  2:10 PM  Result Value Ref Range   ABO/RH(D) A POS    No rh immune globuloin      NOT A RH IMMUNE GLOBULIN CANDIDATE, PT RH POSITIVE Performed at [facility], [address] [place], [place] [address]   CBC     Status: Abnormal   Collection Time: 05/20/23  2:12 PM  Result Value Ref Range   WBC 6.2 4.0 - 10.5 K/uL   RBC 4.43 3.87 - 5.11 MIL/uL   Hemoglobin 13.2 12.0 - 15.0 g/dL   HCT 30.8 65.7 - 84.6 %   MCV 84.4 80.0 - 100.0 fL   MCH 29.8 26.0 - 34.0 pg   MCHC 35.3 30.0 - 36.0 g/dL   RDW 96.2 (L) 95.2 - 84.1 %   Platelets 288 150 - 400 K/uL   nRBC 0.0 0.0 - 0.2 %  hCG, quantitative,  pregnancy     Status: Abnormal   Collection Time: 05/20/23  2:12 PM  Result Value Ref Range   hCG, Beta Chain, Quant, S 107,620 (H) <5 mIU/mL     US OB LESS THAN 14 WEEKS WITH OB TRANSVAGINAL Result Date: 05/20/2023 CLINICAL DATA:  Abdominal pain in 1st trimester pregnancy. EXAM: OBSTETRIC <14 WK [place] AND TRANSVAGINAL OB US TECHNIQUE: Both transabdominal and transvaginal ultrasound examinations were performed for complete evaluation of the gestation as well as the maternal uterus, adnexal regions, and pelvic cul-de-sac. Transvaginal technique was performed to assess early pregnancy. COMPARISON:  None Available. FINDINGS: Intrauterine gestational sac: Single Yolk sac:  Visualized. Embryo:  Visualized. Cardiac Activity: Visualized. Heart Rate: 122 bpm CRL:  6 mm   6 w   3 d                  [place] EDC: 01/10/2024 Subchorionic hemorrhage:  None visualized. Maternal uterus/adnexae: Both ovaries are normal in appearance. No mass or abnormal free fluid identified. IMPRESSION: Single living IUP with estimated gestational age of [redacted] weeks 3 days, and [place] EDC of 01/10/2024. Electronically Signed   By: Danae Orleans M.D.   On: 05/20/2023 16:07     MDM Labs ordered and reviewed.   Zofran ODT LR bolus Compazine  CNM independently reviewed the imaging ordered. Imaging show live IUP  Assessment and Plan   1. Normal intrauterine pregnancy on prenatal ultrasound in first trimester   2. Nausea/vomiting in pregnancy   3. [redacted] weeks gestation of pregnancy   4. Candidiasis of vagina during pregnancy     -Discharge home in stable condition -Rx for antiemetics sent to pharmacy -First trimester precautions discussed -Patient advised to follow-up with OB as scheduled for prenatal care -Patient may return to MAU as needed or if her condition were to change or worsen  Rolm Bookbinder, CNM 05/20/2023, 4:12 PM

## 2023-05-20 NOTE — MAU Note (Signed)
Marilyn Ramirez is a 28 y.o. at Unknown here in MAU reporting: she's has N/V for the past 4 days, states hasn't been able to eat or drink.  Reports has vomited twice in the past 24 hours.  Also reports mild cramping, denies VB.  Reports +HPT.  LMP: 03/29/2023 Onset of complaint: 4 days ago Pain score: 4 Vitals:   05/20/23 1327  BP: (!) 103/57  Pulse: 72  Resp: 18  Temp: 98.3 F (36.8 C)  SpO2: 100%     FHT: NA  Lab orders placed from triage: UPT

## 2023-05-20 NOTE — Discharge Instructions (Signed)

## 2023-05-22 LAB — GC/CHLAMYDIA PROBE AMP (~~LOC~~) NOT AT ARMC
Chlamydia: NEGATIVE
Comment: NEGATIVE
Comment: NORMAL
Neisseria Gonorrhea: NEGATIVE

## 2023-10-16 ENCOUNTER — Ambulatory Visit: Payer: 59 | Admitting: Adult Health

## 2023-10-16 ENCOUNTER — Encounter: Payer: Self-pay | Admitting: Adult Health

## 2023-10-16 NOTE — Progress Notes (Deleted)
 Referring:  No referring provider defined for this encounter.  PCP: Patient, No Pcp Per    CC:  headaches  History provided from self  Follow-up visit:  Prior visit: 04/03/2023  Brief HPI:   Marilyn Ramirez is a 28 y.o. 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 Self d/c'd Diamox  due to side effects and no noticeable benefit.  At prior visit, recommended increasing topiramate  due to continued headaches and ongoing use of ibuprofen  as needed.  She was referred again to ophthalmology.     Interval history:      Returns today for follow-up visit.  Continues to have occasional headaches behind eyes, about 3x per week.  Currently on topiramate  50 mg nightly, did not increase as previously advised.  Denies any benefit with rizatriptan , has been using ibuprofen  800mg  as needed with benefit. Denies over use. Was not evaluated by ophthalmology as previously advised, denies any significant vision changes but feels she needs to squint more at times.     Headache History: Onset: February 2023 Triggers: eating pork Aura: blurred vision Location: retro-orbital, occipital Associated Symptoms:  Photophobia: yes  Phonophobia: yes  Nausea: yes Worse with activity?: yes Duration of headaches: several hours  Headache days per month: 12 Headache free days per month: 18  Current Treatment: Abortive Tylenol  Advil  Rizatriptan   Preventative Topiramate   Prior Therapies                                 Tylenol  Advil  Rizatriptan  Diamox  750 mg BID - side effects Topiramate  50 mg nightly   LABS: CBC    Component Value Date/Time   WBC 6.2 05/20/2023 1412   RBC 4.43 05/20/2023 1412   HGB 13.2 05/20/2023 1412   HCT 37.4 05/20/2023 1412   PLT 288 05/20/2023 1412   MCV 84.4 05/20/2023 1412   MCH 29.8 05/20/2023 1412   MCHC 35.3 05/20/2023 1412   RDW 11.1 (L) 05/20/2023 1412    LYMPHSABS 1.2 06/15/2022 1908   MONOABS 0.4 06/15/2022 1908   EOSABS 0.0 06/15/2022 1908   BASOSABS 0.0 06/15/2022 1908      Latest Ref Rng & Units 06/19/2022    3:49 AM 06/18/2022    4:39 AM 06/17/2022    8:10 AM  CMP  Glucose 70 - 99 mg/dL 97  97  86   BUN 6 - 20 mg/dL 8  6  8    Creatinine 0.44 - 1.00 mg/dL 9.05  9.11  9.18   Sodium 135 - 145 mmol/L 134  135  137   Potassium 3.5 - 5.1 mmol/L 3.7  3.8  3.4   Chloride 98 - 111 mmol/L 109  106  104   CO2 22 - 32 mmol/L 19  19  24    Calcium 8.9 - 10.3 mg/dL 8.5  9.0  8.4      IMAGING:  CTH 06/15/22: unremarkable  Imaging independently reviewed on October 16, 2023   Current Outpatient Medications on File Prior to Visit  Medication Sig Dispense Refill   benzonatate  (TESSALON ) 100 MG capsule Take 1 capsule (100 mg total) by mouth every 8 (eight) hours. 21 capsule 0   famotidine  (PEPCID ) 20 MG tablet Take 1 tablet (20 mg total) by mouth 2 (two) times daily. 30 tablet 2   metoCLOPramide  (REGLAN ) 10 MG tablet Take 1 tablet (10  mg total) by mouth every 6 (six) hours. 30 tablet 2   Multiple Vitamin (MULTIVITAMIN PO) Take by mouth.     Olopatadine  HCl 0.2 % SOLN Apply 1 drop to eye daily. (Patient not taking: Reported on 04/03/2023) 2.5 mL 0   ondansetron  (ZOFRAN -ODT) 8 MG disintegrating tablet Take 1 tablet (8 mg total) by mouth every 8 (eight) hours as needed. 30 tablet 2   promethazine  (PHENERGAN ) 25 MG tablet Take 1 tablet (25 mg total) by mouth every 6 (six) hours as needed for nausea or vomiting. 30 tablet 2   rizatriptan  (MAXALT ) 10 MG tablet Take 10 mg by mouth as needed for migraine. May repeat in 2 hours if needed     scopolamine  (TRANSDERM-SCOP) 1 MG/3DAYS Place 1 patch (1.5 mg total) onto the skin every 3 (three) days. 10 patch 1   terconazole  (TERAZOL 7 ) 0.4 % vaginal cream Place 1 applicator vaginally at bedtime. 45 g 0   topiramate  (TOPAMAX ) 50 MG tablet Take 1 tablet (50 mg total) by mouth 2 (two) times daily. 60 tablet 5   No  current facility-administered medications on file prior to visit.     Allergies: No Known Allergies  Family History: Family History  Problem Relation Age of Onset   Hypertension Mother    Migraines Neg Hx      Past Medical History: Past Medical History:  Diagnosis Date   Osteoarthritis of knee    Ovarian cyst    Scoliosis     Past Surgical History No past surgical history on file.  Social History: Social History   Tobacco Use   Smoking status: Never   Smokeless tobacco: Never  Vaping Use   Vaping status: Former  Substance Use Topics   Alcohol use: Yes    Comment: wine   Drug use: Not Currently    ROS: Negative for fevers, chills. Positive for headaches, blurred vision. All other systems reviewed and negative unless stated otherwise in HPI.   Physical Exam:   Vital Signs: LMP 03/29/2023  GENERAL: well appearing,in no acute distress,alert SKIN:  Color, texture, turgor normal. No rashes or lesions HEAD:  Normocephalic/atraumatic. CV:  RRR  NEUROLOGICAL: Mental Status: Alert, oriented to person, place and time,Follows commands Cranial Nerves: blurred optic disc margins bilaterally, PERRL, visual fields intact to confrontation, extraocular movements intact, facial sensation intact, no facial droop or ptosis, hearing grossly intact, no dysarthria, palate elevate symmetrically, tongue protrudes midline, shoulder shrug intact and symmetric Motor: muscle strength 5/5 both upper and lower extremities,no drift, normal tone Reflexes: 2+ throughout Sensation: intact to light touch all 4 extremities Coordination: Finger-to- nose-finger intact bilaterally Gait: normal-based     IMPRESSION: 28 year old female with a history of scoliosis who presents for evaluation of IIH (opening pressure 35). She stopped taking Diamox  because she find it helpful and it affected her taste.  Continues to have about 12 headaches per month despite use of topiramate  50 mg nightly,  limited benefit with rizatriptan , has not yet had follow up with ophthalmology.    PLAN: -Increase Topamax  to 50 mg BID. Uptitrate as needed -continue use of ibuprofen  as needed, discussed avoidance of overuse -Referral to Ophthalmology again, discussed importance of evaluation   Follow-up in 6 months or call earlier if needed    I personally spent a total of *** minutes in the care of the patient today including {Time Based Coding:210964241}.   [name] Bogaert, AGNP-BC  [facility] [address] [place], [place] [address]  Phone [phone] Fax [phone] Note: This document was prepared with digital dictation and possible smart phrase technology. Any transcriptional errors that result from this process are unintentional.

## 2023-11-06 ENCOUNTER — Telehealth: Payer: Self-pay | Admitting: Adult Health

## 2023-11-06 NOTE — Telephone Encounter (Signed)
 Patient called to reschedule appointment

## 2023-11-07 ENCOUNTER — Telehealth: Payer: Self-pay | Admitting: Psychiatry

## 2023-11-07 ENCOUNTER — Encounter: Payer: Self-pay | Admitting: Adult Health

## 2023-11-07 ENCOUNTER — Ambulatory Visit (INDEPENDENT_AMBULATORY_CARE_PROVIDER_SITE_OTHER): Admitting: Adult Health

## 2023-11-07 ENCOUNTER — Telehealth: Payer: Self-pay | Admitting: Adult Health

## 2023-11-07 VITALS — BP 102/60 | HR 73 | Ht 62.0 in | Wt 128.0 lb

## 2023-11-07 DIAGNOSIS — G932 Benign intracranial hypertension: Secondary | ICD-10-CM

## 2023-11-07 DIAGNOSIS — R519 Headache, unspecified: Secondary | ICD-10-CM

## 2023-11-07 DIAGNOSIS — G43009 Migraine without aura, not intractable, without status migrainosus: Secondary | ICD-10-CM

## 2023-11-07 MED ORDER — KETOROLAC TROMETHAMINE 60 MG/2ML IM SOLN
30.0000 mg | Freq: Once | INTRAMUSCULAR | Status: AC
  Administered 2023-11-07: 30 mg via INTRAMUSCULAR

## 2023-11-07 MED ORDER — ZONISAMIDE 50 MG PO CAPS: 50.0000 mg | ORAL_CAPSULE | Freq: Two times a day (BID) | ORAL | 5 refills | Status: AC

## 2023-11-07 MED ORDER — METHYLPREDNISOLONE 4 MG PO TBPK: ORAL_TABLET | ORAL | 0 refills | Status: DC

## 2023-11-07 NOTE — Telephone Encounter (Signed)
 Patient called to notify stuck traffic due to an accident, but arrival time is 8:24 am by GPS.

## 2023-11-07 NOTE — Telephone Encounter (Signed)
 Referral for Ophthalmology faxed to [facility] Phone: [phone] Fax: [phone]

## 2023-11-07 NOTE — Progress Notes (Addendum)
 Referring:  No referring provider defined for this encounter.  PCP: Patient, No Pcp Per    CC:  headaches  History provided from self  Follow-up visit:  Prior visit: 04/03/2023  Brief HPI:   Marilyn Ramirez is a 28 y.o. 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 Self d/c'd Diamox  due to side effects and no noticeable benefit.  At prior visit, recommended increasing topiramate  due to continued headaches and ongoing use of ibuprofen  as needed.  She was referred again to ophthalmology.     Interval history:  Returns today for follow-up visit unaccompanied.  Reports worsening of headaches over the past couple of weeks where they have been constant and fluctuate in severity.  Usually worse in the morning and late evening. Worse with sitting up or coughing. Located frontal and occipital, behind eyes with pressure and pulsating sensation in her ears. Denies any changes in vision. Was not evaluated by ophthalmology as previously advised.  She self discontinued topiramate  about 1 month ago as she felt it was not helping and was causing nausea.  She has been taking ibuprofen  800 mg daily over the past couple of weeks but no significant benefit, last dose yesterday morning. Of note, she was pregnant in the beginning of the year but had a miscarriage at [redacted] weeks gestation, patient did not call office reporting pregnancy and continued on all medications. She does have some light sensitivity with current headaches and some nausea, denies phonophobia.     Headache History: Onset: February 2023 Triggers: eating pork Aura: blurred vision Location: retro-orbital, occipital Associated Symptoms:  Photophobia: yes  Phonophobia: yes  Nausea: yes Worse with activity?: yes Duration of headaches: several hours  Headache days per month: 30 Headache free days per month: 0  Current  Treatment: Abortive Ibuprofen   Preventative none  Prior Therapies                                 Tylenol  Advil  Rizatriptan  Diamox  750 mg BID - side effects Topiramate  50 mg nightly - side effects, lack of efficacy     LABS: CBC    Component Value Date/Time   WBC 6.2 05/20/2023 1412   RBC 4.43 05/20/2023 1412   HGB 13.2 05/20/2023 1412   HCT 37.4 05/20/2023 1412   PLT 288 05/20/2023 1412   MCV 84.4 05/20/2023 1412   MCH 29.8 05/20/2023 1412   MCHC 35.3 05/20/2023 1412   RDW 11.1 (L) 05/20/2023 1412   LYMPHSABS 1.2 06/15/2022 1908   MONOABS 0.4 06/15/2022 1908   EOSABS 0.0 06/15/2022 1908   BASOSABS 0.0 06/15/2022 1908      Latest Ref Rng & Units 06/19/2022    3:49 AM 06/18/2022    4:39 AM 06/17/2022    8:10 AM  CMP  Glucose 70 - 99 mg/dL 97  97  86   BUN 6 - 20 mg/dL 8  6  8    Creatinine 0.44 - 1.00 mg/dL 9.05  9.11  9.18   Sodium 135 - 145 mmol/L 134  135  137   Potassium 3.5 - 5.1 mmol/L 3.7  3.8  3.4   Chloride 98 - 111 mmol/L 109  106  104   CO2 22 - 32 mmol/L 19  19  24    Calcium 8.9 - 10.3 mg/dL 8.5  9.0  8.4  IMAGING:  CTH 06/15/22: unremarkable  Imaging independently reviewed on November 07, 2023   Current Outpatient Medications on File Prior to Visit  Medication Sig Dispense Refill   ibuprofen  (ADVIL ) 800 MG tablet Take 800 mg by mouth every 8 (eight) hours as needed.     No current facility-administered medications on file prior to visit.     Allergies: No Known Allergies  Family History: Family History  Problem Relation Age of Onset   Hypertension Mother    Migraines Neg Hx      Past Medical History: Past Medical History:  Diagnosis Date   Osteoarthritis of knee    Ovarian cyst    Scoliosis     Past Surgical History History reviewed. No pertinent surgical history.  Social History: Social History   Tobacco Use   Smoking status: Never   Smokeless tobacco: Never  Vaping Use   Vaping status: Former  Substance Use Topics    Alcohol use: Yes    Comment: wine   Drug use: Not Currently    ROS: Negative for fevers, chills. Positive for headaches, blurred vision. All other systems reviewed and negative unless stated otherwise in HPI.   Physical Exam:   Vital Signs: BP 102/60   Pulse 73   Ht 5' 2 (1.575 m)   Wt 128 lb (58.1 kg)   LMP 03/29/2023   BMI 23.41 kg/m  GENERAL: well appearing,in no acute distress,alert SKIN:  Color, texture, turgor normal. No rashes or lesions HEAD:  Normocephalic/atraumatic. CV:  RRR  NEUROLOGICAL: Mental Status: Alert, oriented to person, place and time,Follows commands Cranial Nerves: blurred optic disc margins bilaterally, PERRL, visual fields intact to confrontation, extraocular movements intact, facial sensation intact, no facial droop or ptosis, hearing grossly intact, no dysarthria, palate elevate symmetrically, tongue protrudes midline, shoulder shrug intact and symmetric Motor: muscle strength 5/5 both upper and lower extremities,no drift, normal tone Reflexes: 2+ throughout Sensation: intact to light touch all 4 extremities Coordination: Finger-to- nose-finger intact bilaterally Gait: normal-based     IMPRESSION: 28 year old female with a history of scoliosis who presents for follow-up of IIH (opening pressure 35).  She self discontinued topiramate  about 1 month ago as she did not find it helpful and caused nausea.  She has been experiencing increased headaches now daily over the past couple of weeks associated with photophobia, nausea and pulsatile tinnitus. Denies vision changes.  Has not yet been evaluated by ophthalmology.    PLAN: - Recommend initiating zonisamide  50 mg twice daily, gradually increase as needed  -she current denies pregnancy, was pregnant back in 04/2023 but unfortunately had miscarriage at [redacted] weeks gestation. She was advised to contact office if pregnancy should reoccur as she should not remain on zonisamide  during pregnancy  - provide  steroid taper pack and Toradol  injection to help break current migraine cycle  - due to positional headache features and worse with coughing, recommend obtaining MRI brain for further evaluation -continue use of ibuprofen  as needed, discussed avoidance of overuse -Referral placed again to Ophthalmology, discussed importance and indication of evaluation as currently unclear if current headaches are related to IIH vs migraine headache. If no papilledema seen on exam, could consider other treatment options if needed. If papilledema seen on exam, may consider pursuing LP although will hold off currently as she reports no significant benefit in regards to headaches with prior LP in 05/2022     Prior patient of Dr. Rush.  Request she establish care with MD for further recommendations.  I personally spent a total of 40 minutes in the care of the patient today including preparing to see the patient, performing a medically appropriate exam/evaluation, counseling and educating, placing orders, and documenting clinical information in the EHR.   [name] Bogaert, AGNP-BC  [facility] [address] [place], [place] [address]  Phone [phone] Fax [phone] Note: This document was prepared with digital dictation and possible smart phrase technology. Any transcriptional errors that result from this process are unintentional.

## 2023-11-07 NOTE — Patient Instructions (Addendum)
 Your Plan:  Start zonisamide  50mg  twice daily - please call after 2 weeks if headaches persist  I will also send you in a steroid taper pack to help break current migraine cycle   You can continue to use ibuprofen  as needed but please limit use to no more than 2-3 times per week due to potential for rebound headache   You will be called to complete an MR brain for further evaluation  You will be called to schedule an ophthalmology evaluation - please ensure you pursue this as this will further determine if your current headaches are being caused by your IIH or if more migrainous type headache      Follow up with Dr. Ines at next available or call earlier if needed     Thank you for coming to see us  at [facility]. I hope we have been able to provide you high quality care today.  You may receive a patient satisfaction survey over the next few weeks. We would appreciate your feedback and comments so that we may continue to improve ourselves and the health of our patients.

## 2023-11-08 ENCOUNTER — Telehealth: Payer: Self-pay | Admitting: Adult Health

## 2023-11-08 NOTE — Telephone Encounter (Signed)
 sent to GI they about Hulan barrows, [employer] auth: [id number] exp. 11/07/23-12/07/23  [phone]

## 2023-11-13 ENCOUNTER — Ambulatory Visit
Admission: RE | Admit: 2023-11-13 | Discharge: 2023-11-13 | Disposition: A | Discharge status: Home / Self Care | Attending: Family Medicine | Admitting: Family Medicine

## 2023-11-13 VITALS — BP 109/56 | HR 96 | Temp 99.2°F | Resp 18 | Ht 62.0 in | Wt 128.0 lb

## 2023-11-13 DIAGNOSIS — J069 Acute upper respiratory infection, unspecified: Secondary | ICD-10-CM

## 2023-11-13 DIAGNOSIS — R059 Cough, unspecified: Secondary | ICD-10-CM

## 2023-11-13 MED ORDER — AZITHROMYCIN 250 MG PO TABS: 250.0000 mg | ORAL_TABLET | Freq: Every day | ORAL | 0 refills | Status: AC

## 2023-11-13 MED ORDER — PREDNISONE 20 MG PO TABS: ORAL_TABLET | ORAL | 0 refills | Status: AC

## 2023-11-13 NOTE — Discharge Instructions (Addendum)
 Advised patient to take medications as directed with food completion.  Advised patient take prednisone  with Zithromax  daily for the next 5 days.  Encouraged increase daily water intake to 64 ounces per day while taking these medications.  Advised if symptoms worsen and/or unresolved please follow-up with your PCP or here for further evaluation.

## 2023-11-13 NOTE — ED Triage Notes (Signed)
 Patient c/o HA, cough, sore throat, nasal congestion x 2 days.  Ringing in both ears.  Patient has taken Mucinex  and Tylenol .

## 2023-11-13 NOTE — ED Provider Notes (Signed)
 [facility]    CSN: [id number] Arrival date & time: 11/13/23  1839      History   Chief Complaint Chief Complaint  Patient presents with   Cough    Entered by patient    HPI Leana Springston is a 28 y.o. female.   HPI 28 year old female presents with cough, sore throat and nasal congestion x 2 days.  PMH significant for HTN, low back pain, and headache  Past Medical History:  Diagnosis Date   Osteoarthritis of knee    Ovarian cyst    Scoliosis     Patient Active Problem List   Diagnosis Date Noted   Headache 06/16/2022   Tooth infection 06/16/2022   Low back pain 03/14/2011   Essential hypertension 09/17/2007   SKIN RASH 09/17/2007   PROTEINURIA 09/17/2007   DERMATITIS, ATOPIC 09/20/2006    History reviewed. No pertinent surgical history.  OB History     Gravida  1   Para      Term      Preterm      AB      Living         SAB      IAB      Ectopic      Multiple      Live Births               Home Medications    Prior to Admission medications   Medication Sig Start Date End Date Taking? Authorizing Provider  azithromycin  (ZITHROMAX ) 250 MG tablet Take 1 tablet (250 mg total) by mouth daily. Take first 2 tablets together, then 1 every day until finished. 11/13/23  Yes Teddy Sharper, FNP  predniSONE  (DELTASONE ) 20 MG tablet Take 2 tabs PO daily x 5 days. 11/13/23  Yes Teddy Sharper, FNP  zonisamide  (ZONEGRAN ) 50 MG capsule Take 1 capsule (50 mg total) by mouth 2 (two) times daily. 11/07/23  Yes McCue, [name], NP  ibuprofen  (ADVIL ) 800 MG tablet Take 800 mg by mouth every 8 (eight) hours as needed.    [provider]    Family History Family History  Problem Relation Age of Onset   Hypertension Mother    Migraines Neg Hx     Social History Social History   Tobacco Use   Smoking status: Never   Smokeless tobacco: Never  Vaping Use   Vaping status: Former  Substance Use Topics   Alcohol use: Yes     Comment: wine   Drug use: Not Currently     Allergies   Patient has no known allergies.   Review of Systems Review of Systems  HENT:  Positive for congestion.   Respiratory:  Positive for cough.      Physical Exam Triage Vital Signs ED Triage Vitals  Encounter Vitals Group     BP      Girls Systolic BP Percentile      Girls Diastolic BP Percentile      Boys Systolic BP Percentile      Boys Diastolic BP Percentile      Pulse      Resp      Temp      Temp src      SpO2      Weight      Height      Head Circumference      Peak Flow      Pain Score      Pain Loc      Pain  Education      Exclude from Growth Chart    No data found.  Updated Vital Signs BP (!) 109/56 (BP Location: Right Arm)   Pulse 96   Temp 99.2 F (37.3 C) (Oral)   Resp 18   Ht 5' 2 (1.575 m)   Wt 128 lb (58.1 kg)   LMP 10/15/2023 (Exact Date)   SpO2 99%   Breastfeeding No   BMI 23.41 kg/m    Physical Exam Vitals and nursing note reviewed.  Constitutional:      Appearance: She is normal weight.  HENT:     Head: Normocephalic and atraumatic.     Mouth/Throat:     Mouth: Mucous membranes are moist.     Pharynx: Oropharynx is clear.  Eyes:     Extraocular Movements: Extraocular movements intact.     Conjunctiva/sclera: Conjunctivae normal.     Pupils: Pupils are equal, round, and reactive to light.  Cardiovascular:     Rate and Rhythm: Normal rate and regular rhythm.     Pulses: Normal pulses.     Heart sounds: Normal heart sounds.  Pulmonary:     Effort: Pulmonary effort is normal.     Breath sounds: Normal breath sounds. No wheezing, rhonchi or rales.  Musculoskeletal:        General: Normal range of motion.     Cervical back: Normal range of motion and neck supple.  Skin:    General: Skin is warm and dry.  Neurological:     General: No focal deficit present.     Mental Status: She is alert and oriented to person, place, and time. Mental status is at baseline.   Psychiatric:        Mood and Affect: Mood normal.        Behavior: Behavior normal.      UC Treatments / Results  Labs (all labs ordered are listed, but only abnormal results are displayed) Labs Reviewed - No data to display  EKG   Radiology No results found.  Procedures Procedures (including critical care time)  Medications Ordered in UC Medications - No data to display  Initial Impression / Assessment and Plan / UC Course  I have reviewed the triage vital signs and the nursing notes.  Pertinent labs & imaging results that were available during my care of the patient were reviewed by me and considered in my medical decision making (see chart for details).     MDM: 1.  Acute URI-Rx'd Zithromax : Take as directed; 2.  Cough, unspecified type-Rx'd prednisone  20 mg tablet: Take 2 tablets p.o. daily x 5 days. Advised patient to take medications as directed with food completion.  Advised patient take prednisone  with Zithromax  daily for the next 5 days.  Encouraged increase daily water intake to 64 ounces per day while taking these medications.  Advised if symptoms worsen and/or unresolved please follow-up with your PCP or here for further evaluation.  Patient discharged home, hemodynamically stable, work note provided to patient per her request prior to discharge. Final Clinical Impressions(s) / UC Diagnoses   Final diagnoses:  Cough, unspecified type  Acute URI     Discharge Instructions      Advised patient to take medications as directed with food completion.  Advised patient take prednisone  with Zithromax  daily for the next 5 days.  Encouraged increase daily water intake to 64 ounces per day while taking these medications.  Advised if symptoms worsen and/or unresolved please follow-up with your PCP or here  for further evaluation.     ED Prescriptions     Medication Sig Dispense Auth. Provider   predniSONE  (DELTASONE ) 20 MG tablet Take 2 tabs PO daily x 5 days. 10  tablet Naiomy Watters, FNP   azithromycin  (ZITHROMAX ) 250 MG tablet Take 1 tablet (250 mg total) by mouth daily. Take first 2 tablets together, then 1 every day until finished. 6 tablet Tiandre Teall, FNP      PDMP not reviewed this encounter.   Teddy Sharper, FNP 11/13/23 1933

## 2023-11-27 NOTE — Telephone Encounter (Signed)
 Called the patient and there was no answer. Left a message advising the patient [name] was wanting us  to follow up on whether the patient was scheduled for an apt with Dr Octavia. Advised pt to call back or reply to [facility]  **If the pt calls back. Referral was sent 7/15. [name] is just asking if the pt had been scheduled. Please ask if she has an appt or has had an apt yet? If not, provide the number to Dr Milford office to check on getting scheduled. Referral team can also check and ensure if the referral in fact went through. Thanks

## 2023-11-28 NOTE — Telephone Encounter (Signed)
 Patient was evaluated by Dr. Octavia on 11/27/2023, OV note and exam reviewed.  No optic nerve edema noted, plans on follow-up exam in 3 months.  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  As there is no evidence of papilledema, if she continues to experience headaches on current regimen, we can consider change in therapy.  If she is interested in alternate therapy, please schedule follow-up visit to further discuss.

## 2023-11-29 ENCOUNTER — Encounter: Payer: Self-pay | Admitting: Adult Health

## 2023-11-29 NOTE — Telephone Encounter (Signed)
 Called the pt to discuss. There was no answer. LVM asking pt to call back or review mychart and respond to that.

## 2023-11-29 NOTE — Telephone Encounter (Signed)
 Called the patient and discussed. She states that she has not noted improvement with   8 am with [name] NP headaches/migraines. I have made an apt for the pt on Monday at 8:15 am.  Pt has not received a call from GI about scheduling MRI. Provided her with the [facility] phone number

## 2023-11-30 NOTE — Progress Notes (Deleted)
 Referring:  No referring provider defined for this encounter.  PCP: Patient, No Pcp Per    CC:  headaches  History provided from self  Follow-up visit:  Prior visit: 11/07/2023  Brief HPI:   [name] is a 28 y.o. 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 Self d/c'd Diamox  and topiramate  in the past due to side effects and no noticeable benefit.  At prior visit, initiated zonisamide  50 mg twice daily with reports of worsening headaches, worse with position and coughing with pulsating sensation in ears.  No visual changes.  She was advised to be seen by ophthalmology and proceed with MRI brain.      Interval history:  Patient returns for sooner visit to discuss further treatment plan.  She was evaluated by Dr. Octavia who noted no papilledema on examination.  Scheduled for MRI brain tomorrow.   Returns today for follow-up visit unaccompanied.  Reports worsening of headaches over the past couple of weeks where they have been constant and fluctuate in severity.  Usually worse in the morning and late evening. Worse with sitting up or coughing. Located frontal and occipital, behind eyes with pressure and pulsating sensation in her ears. Denies any changes in vision. Was not evaluated by ophthalmology as previously advised.  She self discontinued topiramate  about 1 month ago as she felt it was not helping and was causing nausea.  She has been taking ibuprofen  800 mg daily over the past couple of weeks but no significant benefit, last dose yesterday morning. Of note, she was pregnant in the beginning of the year but had a miscarriage at [redacted] weeks gestation, patient did not call office reporting pregnancy and continued on all medications. She does have some light sensitivity with current headaches and some nausea, denies phonophobia.     Headache History: Onset: February 2023 Triggers: eating  pork Aura: blurred vision Location: retro-orbital, occipital Associated Symptoms:  Photophobia: yes  Phonophobia: yes  Nausea: yes Worse with activity?: yes Duration of headaches: several hours  Headache days per month: 30 Headache free days per month: 0  Current Treatment: Abortive Ibuprofen   Preventative none  Prior Therapies                                 Tylenol  Advil  Rizatriptan  Diamox  750 mg BID - side effects Topiramate  50 mg nightly - side effects, lack of efficacy     LABS: CBC    Component Value Date/Time   WBC 6.2 05/20/2023 1412   RBC 4.43 05/20/2023 1412   HGB 13.2 05/20/2023 1412   HCT 37.4 05/20/2023 1412   PLT 288 05/20/2023 1412   MCV 84.4 05/20/2023 1412   MCH 29.8 05/20/2023 1412   MCHC 35.3 05/20/2023 1412   RDW 11.1 (L) 05/20/2023 1412   LYMPHSABS 1.2 06/15/2022 1908   MONOABS 0.4 06/15/2022 1908   EOSABS 0.0 06/15/2022 1908   BASOSABS 0.0 06/15/2022 1908      Latest Ref Rng & Units 06/19/2022    3:49 AM 06/18/2022    4:39 AM 06/17/2022    8:10 AM  CMP  Glucose 70 - 99 mg/dL 97  97  86   BUN 6 - 20 mg/dL 8  6  8    Creatinine 0.44 - 1.00 mg/dL 9.05  9.11  9.18   Sodium 135 -  145 mmol/L 134  135  137   Potassium 3.5 - 5.1 mmol/L 3.7  3.8  3.4   Chloride 98 - 111 mmol/L 109  106  104   CO2 22 - 32 mmol/L 19  19  24    Calcium 8.9 - 10.3 mg/dL 8.5  9.0  8.4      IMAGING:  CTH 06/15/22: unremarkable  Imaging independently reviewed on November 30, 2023   Current Outpatient Medications on File Prior to Visit  Medication Sig Dispense Refill   azithromycin  (ZITHROMAX ) 250 MG tablet Take 1 tablet (250 mg total) by mouth daily. Take first 2 tablets together, then 1 every day until finished. 6 tablet 0   ibuprofen  (ADVIL ) 800 MG tablet Take 800 mg by mouth every 8 (eight) hours as needed.     predniSONE  (DELTASONE ) 20 MG tablet Take 2 tabs PO daily x 5 days. 10 tablet 0   zonisamide  (ZONEGRAN ) 50 MG capsule Take 1 capsule (50 mg total) by  mouth 2 (two) times daily. 60 capsule 5   No current facility-administered medications on file prior to visit.     Allergies: No Known Allergies  Family History: Family History  Problem Relation Age of Onset   Hypertension Mother    Migraines Neg Hx      Past Medical History: Past Medical History:  Diagnosis Date   Osteoarthritis of knee    Ovarian cyst    Scoliosis     Past Surgical History No past surgical history on file.  Social History: Social History   Tobacco Use   Smoking status: Never   Smokeless tobacco: Never  Vaping Use   Vaping status: Former  Substance Use Topics   Alcohol use: Yes    Comment: wine   Drug use: Not Currently    ROS: Negative for fevers, chills. Positive for headaches, blurred vision. All other systems reviewed and negative unless stated otherwise in HPI.   Physical Exam:   Vital Signs: LMP 03/29/2023  GENERAL: well appearing,in no acute distress,alert SKIN:  Color, texture, turgor normal. No rashes or lesions HEAD:  Normocephalic/atraumatic. CV:  RRR  NEUROLOGICAL: Mental Status: Alert, oriented to person, place and time,Follows commands Cranial Nerves: blurred optic disc margins bilaterally, PERRL, visual fields intact to confrontation, extraocular movements intact, facial sensation intact, no facial droop or ptosis, hearing grossly intact, no dysarthria, palate elevate symmetrically, tongue protrudes midline, shoulder shrug intact and symmetric Motor: muscle strength 5/5 both upper and lower extremities,no drift, normal tone Reflexes: 2+ throughout Sensation: intact to light touch all 4 extremities Coordination: Finger-to- nose-finger intact bilaterally Gait: normal-based     IMPRESSION: 28 year old female with a history of scoliosis who presents for follow-up of IIH and headaches. She was seen last month with worsening headaches and started zonisamide .  She was evaluated by ophthalmology last week without evidence of  papilledema.      PLAN: - Recommend initiating zonisamide  50 mg twice daily, gradually increase as needed  -complete MRI brain tomorrow as scheduled -Continue to follow with ophthalmology as advised -continue use of ibuprofen  as needed, discussed avoidance of overuse      Follow-up with Dr. Ines in December as scheduled to establish care    I personally spent a total of 40 minutes in the care of the patient today including preparing to see the patient, performing a medically appropriate exam/evaluation, counseling and educating, placing orders, and documenting clinical information in the EHR.   [name] Bogaert, AGNP-BC  [facility]  [address] [place], [place] [address]  Phone [phone] Fax [phone] Note: This document was prepared with digital dictation and possible smart phrase technology. Any transcriptional errors that result from this process are unintentional.

## 2023-12-04 ENCOUNTER — Encounter: Payer: Self-pay | Admitting: Adult Health

## 2023-12-04 ENCOUNTER — Telehealth: Payer: Self-pay | Admitting: Adult Health

## 2023-12-04 ENCOUNTER — Ambulatory Visit: Admitting: Adult Health

## 2023-12-04 NOTE — Telephone Encounter (Signed)
 Pt called to cancel appt  due to transportation   Appr rescheduled

## 2023-12-05 ENCOUNTER — Ambulatory Visit
Admission: RE | Admit: 2023-12-05 | Discharge: 2023-12-05 | Disposition: A | Discharge status: Home / Self Care | Source: Ambulatory Visit | Attending: Adult Health | Admitting: Adult Health

## 2023-12-05 DIAGNOSIS — G932 Benign intracranial hypertension: Secondary | ICD-10-CM

## 2023-12-05 DIAGNOSIS — G43009 Migraine without aura, not intractable, without status migrainosus: Secondary | ICD-10-CM | POA: Diagnosis not present

## 2023-12-05 DIAGNOSIS — R519 Headache, unspecified: Secondary | ICD-10-CM

## 2023-12-05 MED ORDER — GADOPICLENOL 0.5 MMOL/ML IV SOLN
7.5000 mL | Freq: Once | INTRAVENOUS | Status: AC | PRN
  Administered 2023-12-05 (×2): 6 mL via INTRAVENOUS

## 2023-12-06 ENCOUNTER — Ambulatory Visit: Payer: Self-pay | Admitting: Adult Health

## 2023-12-15 ENCOUNTER — Encounter: Payer: Self-pay | Admitting: Adult Health

## 2024-01-30 ENCOUNTER — Telehealth: Payer: Self-pay | Admitting: Neurology

## 2024-01-30 NOTE — Telephone Encounter (Signed)
 Called and left voicemail for patient to reschedule appointment on 03/27/24 with Dr Ines.  If patient calls back, they can be rescheduled with Dr Vear

## 2024-03-11 ENCOUNTER — Ambulatory Visit: Admitting: Adult Health

## 2024-03-11 ENCOUNTER — Encounter: Payer: Self-pay | Admitting: Adult Health

## 2024-03-11 NOTE — Progress Notes (Deleted)
 Referring:  No referring provider defined for this encounter.  PCP: Patient, No Pcp Per    CC:  headaches  History provided from self  Follow-up visit:  Prior visit: 04/03/2023  Brief HPI:   Marilyn Ramirez is a 28 y.o. 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 Self d/c'd Diamox  due to side effects and no noticeable benefit.  She also self discontinued topiramate  around 09/2023 due to side effects.  At prior visit, she was started on zonisamide  due to almost daily headaches.  She also reported positional component of headaches therefore proceeded with MRI brain which was largely unremarkable.  She was referred again to ophthalmology.     Interval history:    Evaluated at [facility] in 11/2022 without evidence of papilledema.    Returns today for follow-up visit unaccompanied.  Reports worsening of headaches over the past couple of weeks where they have been constant and fluctuate in severity.  Usually worse in the morning and late evening. Worse with sitting up or coughing. Located frontal and occipital, behind eyes with pressure and pulsating sensation in her ears. Denies any changes in vision. Was not evaluated by ophthalmology as previously advised.  She self discontinued topiramate  about 1 month ago as she felt it was not helping and was causing nausea.  She has been taking ibuprofen  800 mg daily over the past couple of weeks but no significant benefit, last dose yesterday morning. Of note, she was pregnant in the beginning of the year but had a miscarriage at [redacted] weeks gestation, patient did not call office reporting pregnancy and continued on all medications. She does have some light sensitivity with current headaches and some nausea, denies phonophobia.      Headache History: Onset: February 2023 Triggers: eating pork Aura: blurred vision Location: retro-orbital,  occipital Associated Symptoms:  Photophobia: yes  Phonophobia: yes  Nausea: yes Worse with activity?: yes Duration of headaches: several hours  Headache days per month: 30 Headache free days per month: 0  Current Treatment: Abortive Ibuprofen   Preventative none  Prior Therapies                                 Tylenol  Advil  Rizatriptan  Diamox  750 mg BID - side effects Topiramate  50 mg nightly - side effects, lack of efficacy     LABS: CBC    Component Value Date/Time   WBC 6.2 05/20/2023 1412   RBC 4.43 05/20/2023 1412   HGB 13.2 05/20/2023 1412   HCT 37.4 05/20/2023 1412   PLT 288 05/20/2023 1412   MCV 84.4 05/20/2023 1412   MCH 29.8 05/20/2023 1412   MCHC 35.3 05/20/2023 1412   RDW 11.1 (L) 05/20/2023 1412   LYMPHSABS 1.2 06/15/2022 1908   MONOABS 0.4 06/15/2022 1908   EOSABS 0.0 06/15/2022 1908   BASOSABS 0.0 06/15/2022 1908      Latest Ref Rng & Units 06/19/2022    3:49 AM 06/18/2022    4:39 AM 06/17/2022    8:10 AM  CMP  Glucose 70 - 99 mg/dL 97  97  86   BUN 6 - 20 mg/dL 8  6  8    Creatinine 0.44 - 1.00 mg/dL 9.05  9.11  9.18   Sodium 135 - 145 mmol/L 134  135  137   Potassium 3.5 -  5.1 mmol/L 3.7  3.8  3.4   Chloride 98 - 111 mmol/L 109  106  104   CO2 22 - 32 mmol/L 19  19  24    Calcium 8.9 - 10.3 mg/dL 8.5  9.0  8.4      IMAGING:  CTH 06/15/22: unremarkable  Imaging independently reviewed on March 11, 2024   Current Outpatient Medications on File Prior to Visit  Medication Sig Dispense Refill   azithromycin  (ZITHROMAX ) 250 MG tablet Take 1 tablet (250 mg total) by mouth daily. Take first 2 tablets together, then 1 every day until finished. 6 tablet 0   ibuprofen  (ADVIL ) 800 MG tablet Take 800 mg by mouth every 8 (eight) hours as needed.     predniSONE  (DELTASONE ) 20 MG tablet Take 2 tabs PO daily x 5 days. 10 tablet 0   zonisamide  (ZONEGRAN ) 50 MG capsule Take 1 capsule (50 mg total) by mouth 2 (two) times daily. 60 capsule 5   No  current facility-administered medications on file prior to visit.     Allergies: No Known Allergies  Family History: Family History  Problem Relation Age of Onset   Hypertension Mother    Migraines Neg Hx      Past Medical History: Past Medical History:  Diagnosis Date   Osteoarthritis of knee    Ovarian cyst    Scoliosis     Past Surgical History No past surgical history on file.  Social History: Social History   Tobacco Use   Smoking status: Never   Smokeless tobacco: Never  Vaping Use   Vaping status: Former  Substance Use Topics   Alcohol use: Yes    Comment: wine   Drug use: Not Currently    ROS: Negative for fevers, chills. Positive for headaches, blurred vision. All other systems reviewed and negative unless stated otherwise in HPI.   Physical Exam:   Vital Signs: LMP 03/29/2023  GENERAL: well appearing,in no acute distress,alert SKIN:  Color, texture, turgor normal. No rashes or lesions HEAD:  Normocephalic/atraumatic. CV:  RRR  NEUROLOGICAL: Mental Status: Alert, oriented to person, place and time,Follows commands Cranial Nerves: blurred optic disc margins bilaterally, PERRL, visual fields intact to confrontation, extraocular movements intact, facial sensation intact, no facial droop or ptosis, hearing grossly intact, no dysarthria, palate elevate symmetrically, tongue protrudes midline, shoulder shrug intact and symmetric Motor: muscle strength 5/5 both upper and lower extremities,no drift, normal tone Reflexes: 2+ throughout Sensation: intact to light touch all 4 extremities Coordination: Finger-to- nose-finger intact bilaterally Gait: normal-based     IMPRESSION: 28 year old female with a history of scoliosis who presents for follow-up of IIH (opening pressure 35).  She self discontinued topiramate  about 1 month ago as she did not find it helpful and caused nausea.  She has been experiencing increased headaches now daily over the past  couple of weeks associated with photophobia, nausea and pulsatile tinnitus. Denies vision changes.  Has not yet been evaluated by ophthalmology.    PLAN: - Recommend initiating zonisamide  50 mg twice daily, gradually increase as needed  -she current denies pregnancy, was pregnant back in 04/2023 but unfortunately had miscarriage at [redacted] weeks gestation. She was advised to contact office if pregnancy should reoccur as she should not remain on zonisamide  during pregnancy  - provide steroid taper pack and Toradol  injection to help break current migraine cycle  - due to positional headache features and worse with coughing, recommend obtaining MRI brain for further evaluation -continue use of ibuprofen   as needed, discussed avoidance of overuse -Referral placed again to Ophthalmology, discussed importance and indication of evaluation as currently unclear if current headaches are related to IIH vs migraine headache. If no papilledema seen on exam, could consider other treatment options if needed. If papilledema seen on exam, may consider pursuing LP although will hold off currently as she reports no significant benefit in regards to headaches with prior LP in 05/2022     Prior patient of Dr. Rush.  Request she establish care with MD for further recommendations.    I personally spent a total of 40 minutes in the care of the patient today including preparing to see the patient, performing a medically appropriate exam/evaluation, counseling and educating, placing orders, and documenting clinical information in the EHR.   [name] Bogaert, AGNP-BC  [facility] [address] [place], [place] [address]  Phone [phone] Fax [phone] Note: This document was prepared with digital dictation and possible smart phrase technology. Any transcriptional errors that result from this process are unintentional.

## 2024-03-27 ENCOUNTER — Ambulatory Visit: Admitting: Neurology
